# Patient Record
Sex: Male | Born: 1960 | Race: White | Hispanic: No | Marital: Married | Smoking: Never smoker
Health system: Southern US, Community
[De-identification: ages and names within clinical notes are randomized; demographics above are authoritative.]

## PROBLEM LIST (undated history)

## (undated) DIAGNOSIS — Z87442 Personal history of urinary calculi: Secondary | ICD-10-CM

## (undated) DIAGNOSIS — E559 Vitamin D deficiency, unspecified: Secondary | ICD-10-CM

## (undated) DIAGNOSIS — N189 Chronic kidney disease, unspecified: Secondary | ICD-10-CM

## (undated) DIAGNOSIS — D369 Benign neoplasm, unspecified site: Secondary | ICD-10-CM

## (undated) DIAGNOSIS — T7840XA Allergy, unspecified, initial encounter: Secondary | ICD-10-CM

## (undated) HISTORY — DX: Personal history of urinary calculi: Z87.442

## (undated) HISTORY — DX: Vitamin D deficiency, unspecified: E55.9

## (undated) HISTORY — DX: Benign neoplasm, unspecified site: D36.9

## (undated) HISTORY — PX: POLYPECTOMY: SHX149

## (undated) HISTORY — PX: COLONOSCOPY: SHX174

## (undated) HISTORY — DX: Allergy, unspecified, initial encounter: T78.40XA

---
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
## 2008-12-03 ENCOUNTER — Ambulatory Visit: Payer: Self-pay | Admitting: Internal Medicine

## 2009-07-15 ENCOUNTER — Ambulatory Visit: Payer: Self-pay | Admitting: Internal Medicine

## 2009-07-15 DIAGNOSIS — J069 Acute upper respiratory infection, unspecified: Secondary | ICD-10-CM | POA: Insufficient documentation

## 2010-12-16 ENCOUNTER — Ambulatory Visit (INDEPENDENT_AMBULATORY_CARE_PROVIDER_SITE_OTHER): Payer: Self-pay | Admitting: Internal Medicine

## 2010-12-16 ENCOUNTER — Encounter: Payer: Self-pay | Admitting: Internal Medicine

## 2010-12-16 VITALS — BP 120/80 | Temp 98.3°F | Ht 68.5 in | Wt 209.0 lb

## 2010-12-16 DIAGNOSIS — K1120 Sialoadenitis, unspecified: Secondary | ICD-10-CM

## 2010-12-16 MED ORDER — AMOXICILLIN-POT CLAVULANATE 500-125 MG PO TABS: 1.0000 | ORAL_TABLET | Freq: Three times a day (TID) | ORAL | Status: AC

## 2010-12-16 NOTE — Progress Notes (Signed)
  Subjective:    Patient ID: [name], male    DOB: [birth year], 50 y.o.   MRN: [id number]  HPI 50 year old patient who presents with a three-day history of increasing swelling involving his right neck and facial area. Denies any URI symptoms. Denies any sore throat no fever or chills or cough. He has had no similar problems in the past    Review of Systems  Constitutional: Negative for fever, chills, appetite change and fatigue.  HENT: Negative for hearing loss, ear pain, congestion, sore throat, trouble swallowing, neck stiffness, dental problem, voice change and tinnitus.   Eyes: Negative for pain, discharge and visual disturbance.  Respiratory: Negative for cough, chest tightness, wheezing and stridor.   Cardiovascular: Negative for chest pain, palpitations and leg swelling.  Gastrointestinal: Negative for nausea, vomiting, abdominal pain, diarrhea, constipation, blood in stool and abdominal distention.  Genitourinary: Negative for urgency, hematuria, flank pain, discharge, difficulty urinating and genital sores.  Musculoskeletal: Negative for myalgias, back pain, joint swelling, arthralgias and gait problem.  Skin: Negative for rash.  Neurological: Negative for dizziness, syncope, speech difficulty, weakness, numbness and headaches.  Hematological: Positive for adenopathy. Does not bruise/bleed easily.  Psychiatric/Behavioral: Negative for behavioral problems and dysphoric mood. The patient is not nervous/anxious.        Objective:   Physical Exam  Constitutional: He is oriented to person, place, and time. He appears well-developed and well-nourished. No distress.  HENT:  Head: Normocephalic.  Right Ear: External ear normal.  Left Ear: External ear normal.  Eyes: Conjunctivae and EOM are normal.  Neck: Normal range of motion.       There is swelling and slight tenderness of the right parotid gland  Cardiovascular: Normal rate and normal heart sounds.   Pulmonary/Chest:  Breath sounds normal.  Abdominal: Bowel sounds are normal.  Musculoskeletal: Normal range of motion. He exhibits no edema and no tenderness.  Neurological: He is alert and oriented to person, place, and time.  Psychiatric: He has a normal mood and affect. His behavior is normal.          Assessment & Plan:  Parotitis. We'll treat with Augmentin warm compresses and clinically observed. Has been asked to use tart candy. He will call if does not promptly improve or if he develops any fever chills or worsening pain

## 2010-12-16 NOTE — Patient Instructions (Signed)
Take your antibiotic as prescribed until ALL of it is gone, but stop if you develop a rash, swelling, or any side effects of the medication.  Contact our office as soon as possible if  there are side effects of the medication.  Warm compresses to area four times daily  Call or return to clinic prn if these symptoms worsen or fail to improve as anticipated.

## 2010-12-29 ENCOUNTER — Telehealth: Payer: Self-pay | Admitting: Internal Medicine

## 2010-12-29 DIAGNOSIS — R220 Localized swelling, mass and lump, head: Secondary | ICD-10-CM

## 2010-12-29 NOTE — Telephone Encounter (Signed)
Please advise 

## 2010-12-29 NOTE — Telephone Encounter (Signed)
Order placed - terri aware . Pt at ent office now. KIK

## 2010-12-29 NOTE — Telephone Encounter (Signed)
Ok for ent referral 

## 2010-12-29 NOTE — Telephone Encounter (Signed)
Pt called and said that condition has gotten worse and pt is req to get referral to ENT as previously discussed.

## 2012-05-09 ENCOUNTER — Other Ambulatory Visit (INDEPENDENT_AMBULATORY_CARE_PROVIDER_SITE_OTHER): Payer: BC Managed Care – PPO

## 2012-05-09 DIAGNOSIS — Z0000 Encounter for general adult medical examination without abnormal findings: Secondary | ICD-10-CM

## 2012-05-09 LAB — POCT URINALYSIS DIPSTICK
Bilirubin, UA: NEGATIVE
Leukocytes, UA: NEGATIVE
Nitrite, UA: NEGATIVE
pH, UA: 5.5

## 2012-05-09 LAB — CBC WITH DIFFERENTIAL/PLATELET
Basophils Absolute: 0.1 10*3/uL (ref 0.0–0.1)
Basophils Relative: 1.1 % (ref 0.0–3.0)
Eosinophils Absolute: 0.2 10*3/uL (ref 0.0–0.7)
MCHC: 32.7 g/dL (ref 30.0–36.0)
MCV: 90.1 fl (ref 78.0–100.0)
Monocytes Absolute: 0.6 10*3/uL (ref 0.1–1.0)
Neutrophils Relative %: 48 % (ref 43.0–77.0)
Platelets: 252 10*3/uL (ref 150.0–400.0)
RDW: 13.3 % (ref 11.5–14.6)

## 2012-05-09 LAB — PSA: PSA: 0.53 ng/mL (ref 0.10–4.00)

## 2012-05-09 LAB — BASIC METABOLIC PANEL
BUN: 20 mg/dL (ref 6–23)
CO2: 27 mEq/L (ref 19–32)
Calcium: 9 mg/dL (ref 8.4–10.5)
Chloride: 105 mEq/L (ref 96–112)
Creatinine, Ser: 1 mg/dL (ref 0.4–1.5)
Glucose, Bld: 85 mg/dL (ref 70–99)

## 2012-05-09 LAB — LIPID PANEL: HDL: 43 mg/dL (ref >39.00)

## 2012-05-09 LAB — HEPATIC FUNCTION PANEL
Bilirubin, Direct: 0.1 mg/dL (ref 0.0–0.3)
Total Bilirubin: 1 mg/dL (ref 0.3–1.2)
Total Protein: 6.3 g/dL (ref 6.0–8.3)

## 2012-05-13 ENCOUNTER — Encounter: Payer: Self-pay | Admitting: Internal Medicine

## 2012-05-20 ENCOUNTER — Encounter: Payer: Self-pay | Admitting: Internal Medicine

## 2012-05-20 ENCOUNTER — Ambulatory Visit (INDEPENDENT_AMBULATORY_CARE_PROVIDER_SITE_OTHER): Payer: BC Managed Care – PPO | Admitting: Internal Medicine

## 2012-05-20 VITALS — BP 102/70 | HR 76 | Temp 97.5°F | Resp 20 | Ht 68.5 in | Wt 218.0 lb

## 2012-05-20 DIAGNOSIS — Z0000 Encounter for general adult medical examination without abnormal findings: Secondary | ICD-10-CM

## 2012-05-20 NOTE — Progress Notes (Signed)
  Subjective:    Patient ID: [name], male    DOB: Aug 22, 1961, 51 y.o.   MRN: [id number]  HPI  Wt Readings from Last 3 Encounters:  05/20/12 218 lb (98.884 kg)  12/16/10 209 lb (94.802 kg)  07/15/09 215 lb (97.523 kg)    History of Present Illness:  51 year-old patient seen today for an annual examination. He has relocated from [place] and has enjoyed excellent health. He's had no prior hospital missions and has no medical illnesses. He takes no chronic medications   Problems Prior to Update:  None   Medications Prior to Update:  1) None   Allergies (verified):  No Known Drug Allergies   Past History:  Past Medical History:  unremarkable   Past Surgical History:  unremarkable   Family History:  Reviewed history and no changes required:  father died at age 50, H. MI, history tobacco use, and alcoholism  mother, age 50, in good health  One sister is well   Social History:  Reviewed history and no changes required:  Married  two sons, age 50 and 5  [employer] insurance agent   Review of Systems  Constitutional: Negative for fever, chills, activity change, appetite change and fatigue.  HENT: Negative for hearing loss, ear pain, congestion, rhinorrhea, sneezing, mouth sores, trouble swallowing, neck pain, neck stiffness, dental problem, voice change, sinus pressure and tinnitus.   Eyes: Negative for photophobia, pain, redness and visual disturbance.  Respiratory: Negative for apnea, cough, choking, chest tightness, shortness of breath and wheezing.   Cardiovascular: Negative for chest pain, palpitations and leg swelling.  Gastrointestinal: Negative for nausea, vomiting, abdominal pain, diarrhea, constipation, blood in stool, abdominal distention, anal bleeding and rectal pain.  Genitourinary: Negative for dysuria, urgency, frequency, hematuria, flank pain, decreased urine volume, discharge, penile swelling, scrotal swelling, difficulty urinating, genital sores and  testicular pain.  Musculoskeletal: Negative for myalgias, back pain, joint swelling, arthralgias and gait problem.  Skin: Negative for color change, rash and wound.  Neurological: Negative for dizziness, tremors, seizures, syncope, facial asymmetry, speech difficulty, weakness, light-headedness, numbness and headaches.  Hematological: Negative for adenopathy. Does not bruise/bleed easily.  Psychiatric/Behavioral: Negative for suicidal ideas, hallucinations, behavioral problems, confusion, disturbed wake/sleep cycle, self-injury, dysphoric mood, decreased concentration and agitation. The patient is not nervous/anxious.        Objective:   Physical Exam  Constitutional: He is oriented to person, place, and time. He appears well-developed.  HENT:  Head: Normocephalic.  Right Ear: External ear normal.  Left Ear: External ear normal.  Eyes: Conjunctivae and EOM are normal.  Neck: Normal range of motion.  Cardiovascular: Normal rate and normal heart sounds.   Pulmonary/Chest: Breath sounds normal.  Abdominal: Bowel sounds are normal.  Musculoskeletal: Normal range of motion. He exhibits no edema and no tenderness.  Neurological: He is alert and oriented to person, place, and time.  Psychiatric: He has a normal mood and affect. His behavior is normal.          Assessment & Plan:   Preventive health exam Mild exogenous obesity  Weight loss exercise all encouraged Screening colonoscopy encouraged Recheck 1 year

## 2012-05-20 NOTE — Patient Instructions (Signed)
It is important that you exercise regularly, at least 20 minutes 3 to 4 times per week.  If you develop chest pain or shortness of breath seek  medical attention.  You need to lose weight.  Consider a lower calorie diet and regular exercise.  Schedule your colonoscopy to help detect colon cancer. 

## 2012-06-08 ENCOUNTER — Encounter: Payer: Self-pay | Admitting: Internal Medicine

## 2012-07-04 ENCOUNTER — Encounter: Payer: Self-pay | Admitting: Internal Medicine

## 2012-07-22 ENCOUNTER — Encounter: Payer: BC Managed Care – PPO | Admitting: Internal Medicine

## 2012-08-22 ENCOUNTER — Ambulatory Visit (AMBULATORY_SURGERY_CENTER): Payer: BC Managed Care – PPO | Admitting: *Deleted

## 2012-08-22 VITALS — Ht 70.0 in | Wt 222.4 lb

## 2012-08-22 DIAGNOSIS — Z1211 Encounter for screening for malignant neoplasm of colon: Secondary | ICD-10-CM

## 2012-08-22 MED ORDER — PEG-KCL-NACL-NASULF-NA ASC-C 100 G PO SOLR: ORAL | Status: DC

## 2012-08-22 NOTE — Progress Notes (Signed)
No allergies to eggs or soy products 

## 2012-08-23 ENCOUNTER — Encounter: Payer: Self-pay | Admitting: Internal Medicine

## 2012-09-05 ENCOUNTER — Encounter: Payer: BC Managed Care – PPO | Admitting: Internal Medicine

## 2012-09-15 ENCOUNTER — Encounter: Payer: Self-pay | Admitting: Internal Medicine

## 2012-09-15 ENCOUNTER — Ambulatory Visit (AMBULATORY_SURGERY_CENTER): Payer: BC Managed Care – PPO | Admitting: Internal Medicine

## 2012-09-15 VITALS — BP 118/75 | HR 67 | Temp 97.9°F | Resp 13 | Ht 70.0 in | Wt 222.0 lb

## 2012-09-15 DIAGNOSIS — D126 Benign neoplasm of colon, unspecified: Secondary | ICD-10-CM

## 2012-09-15 DIAGNOSIS — Z1211 Encounter for screening for malignant neoplasm of colon: Secondary | ICD-10-CM

## 2012-09-15 MED ORDER — SODIUM CHLORIDE 0.9 % IV SOLN: 500.0000 mL | INTRAVENOUS | Status: DC

## 2012-09-15 NOTE — Op Note (Signed)
 [facility] [address] [place], [address]   COLONOSCOPY PROCEDURE REPORT  PATIENT: Ackley, Davy Westmoreland  MR#: [id number] BIRTHDATE: [birth year] , 51  yrs. old GENDER: Male ENDOSCOPIST: Beverley Fiedler, MD REFERRED XB:[id number], Peter PROCEDURE DATE:  09/15/2012 PROCEDURE:   Colonoscopy with snare polypectomy ASA CLASS:   Class I INDICATIONS:average risk screening and first colonoscopy. MEDICATIONS: MAC sedation, administered by CRNA and propofol (Diprivan) 300mg  IV  DESCRIPTION OF PROCEDURE:   After the risks benefits and alternatives of the procedure were thoroughly explained, informed consent was obtained.  A digital rectal exam revealed no rectal mass.   The LB [id number]  endoscope was introduced through the anus and advanced to the cecum, which was identified by both the appendix and ileocecal valve. No adverse events experienced. The quality of the prep was good, using MoviPrep  The instrument was then slowly withdrawn as the colon was fully examined.   COLON FINDINGS: A sessile polyp measuring 5 mm in size was found in the transverse colon.  A polypectomy was performed with a cold snare.  The resection was complete and the polyp tissue was completely retrieved.   Mild diverticulosis was noted in the ascending colon.   The colon mucosa was otherwise normal. Retroflexed views revealed no abnormalities. The time to cecum=2 minutes 31 seconds.  Withdrawal time=9 minutes 00 seconds.  The scope was withdrawn and the procedure completed.  COMPLICATIONS: There were no complications.  ENDOSCOPIC IMPRESSION: 1.   Sessile polyp measuring 5 mm in size was found in the transverse colon; polypectomy was performed with a cold snare 2.   Mild diverticulosis was noted in the ascending colon 3.   The colon mucosa was otherwise normal  RECOMMENDATIONS: 1.  Await pathology results 2.  High fiber diet 3.  If the polyps removed today is proven to be an  adenomatous (pre-cancerous) polyp, you will need a repeat colonoscopy in 5 years.  Otherwise you should continue to follow colorectal cancer screening guidelines for "routine risk" patients with colonoscopy in 10 years.  You will receive a letter within 1-2 weeks with the results of your biopsy as well as final recommendations.  Please call my office if you have not received a letter after 3 weeks.   eSigned:  Beverley Fiedler, MD 09/15/2012 3:10 PM cc:  Gordy Savers, MD and The Patient

## 2012-09-15 NOTE — Progress Notes (Signed)
Called to room to assist during endoscopic procedure.  Patient ID and intended procedure confirmed with present staff. Received instructions for my participation in the procedure from the performing physician.  

## 2012-09-15 NOTE — Patient Instructions (Addendum)
Handouts were given to your care partner on polyps, diverticulosis and high fiber diet.   You may resume your current medications today.  Please call if any questions or concerns.   YOU HAD AN ENDOSCOPIC PROCEDURE TODAY AT THE [facility]: Refer to the procedure report that was given to you for any specific questions about what was found during the examination.  If the procedure report does not answer your questions, please call your gastroenterologist to clarify.  If you requested that your care partner not be given the details of your procedure findings, then the procedure report has been included in a sealed envelope for you to review at your convenience later.  YOU SHOULD EXPECT: Some feelings of bloating in the abdomen. Passage of more gas than usual.  Walking can help get rid of the air that was put into your GI tract during the procedure and reduce the bloating. If you had a lower endoscopy (such as a colonoscopy or flexible sigmoidoscopy) you may notice spotting of blood in your stool or on the toilet paper. If you underwent a bowel prep for your procedure, then you may not have a normal bowel movement for a few days.  DIET: Your first meal following the procedure should be a light meal and then it is ok to progress to your normal diet.  A half-sandwich or bowl of soup is an example of a good first meal.  Heavy or fried foods are harder to digest and may make you feel nauseous or bloated.  Likewise meals heavy in dairy and vegetables can cause extra gas to form and this can also increase the bloating.  Drink plenty of fluids but you should avoid alcoholic beverages for 24 hours.  ACTIVITY: Your care partner should take you home directly after the procedure.  You should plan to take it easy, moving slowly for the rest of the day.  You can resume normal activity the day after the procedure however you should NOT DRIVE or use heavy machinery for 24 hours (because of the sedation medicines  used during the test).    SYMPTOMS TO REPORT IMMEDIATELY: A gastroenterologist can be reached at any hour.  During normal business hours, 8:30 AM to 5:00 PM Monday through Friday, call [phone].  After hours and on weekends, please call the GI answering service at [phone] who will take a message and have the physician on call contact you.   Following lower endoscopy (colonoscopy or flexible sigmoidoscopy):  Excessive amounts of blood in the stool  Significant tenderness or worsening of abdominal pains  Swelling of the abdomen that is new, acute  Fever of 100F or higher    FOLLOW UP: If any biopsies were taken you will be contacted by phone or by letter within the next 1-3 weeks.  Call your gastroenterologist if you have not heard about the biopsies in 3 weeks.  Our staff will call the home number listed on your records the next business day following your procedure to check on you and address any questions or concerns that you may have at that time regarding the information given to you following your procedure. This is a courtesy call and so if there is no answer at the home number and we have not heard from you through the emergency physician on call, we will assume that you have returned to your regular daily activities without incident.  SIGNATURES/CONFIDENTIALITY: You and/or your care partner have signed paperwork which will be entered into   your electronic medical record.  These signatures attest to the fact that that the information above on your After Visit Summary has been reviewed and is understood.  Full responsibility of the confidentiality of this discharge information lies with you and/or your care-partner.  

## 2012-09-15 NOTE — Progress Notes (Signed)
No complaints noted in the recovery room. Maw   

## 2012-09-15 NOTE — Progress Notes (Signed)
Patient did not experience any of the following events: a burn prior to discharge; a fall within the facility; wrong site/side/patient/procedure/implant event; or a hospital transfer or hospital admission upon discharge from the facility. (G8907) Patient did not have preoperative order for IV antibiotic SSI prophylaxis. (G8918)  

## 2012-09-16 ENCOUNTER — Telehealth: Payer: Self-pay | Admitting: *Deleted

## 2012-09-16 NOTE — Telephone Encounter (Signed)
No answer, left message to call office for questions.

## 2012-09-20 ENCOUNTER — Encounter: Payer: Self-pay | Admitting: Internal Medicine

## 2013-01-18 ENCOUNTER — Telehealth: Payer: Self-pay | Admitting: Internal Medicine

## 2013-01-18 MED ORDER — PENCICLOVIR 1 % EX CREA: TOPICAL_CREAM | Freq: Four times a day (QID) | CUTANEOUS | Status: DC

## 2013-01-18 NOTE — Telephone Encounter (Signed)
Spoke to pt told him Rx for Denavir cream was sent to pharmacy apply 4 times a day to area. Pt verbalized understanding.

## 2013-01-18 NOTE — Telephone Encounter (Signed)
Please call in a prescription for Denavir cream  apply to the area 4 times daily

## 2013-01-18 NOTE — Telephone Encounter (Signed)
Patient Information:  Caller Name: [name]  Phone: [phone]  Patient: [name]  Gender: Male  DOB: 11-07-1960  Age: 52 Years  PCP: Eleonore Chiqu[facility])  Office Follow Up:  Does the office need to follow up with this patient?: Yes  Instructions For The Office: would like a rx called in for a fever blister; please call back  RN Note:  uses [facility] at [facility]  Symptoms  Reason For Call & Symptoms: c/o fever blister to the lip; says it looks awful; says he had one in the past that he got a rx for and it disappeared quickly; using Blistex, but seems that it has only gotten worse  Reviewed Health History In EMR: Yes  Reviewed Medications In EMR: Yes  Reviewed Allergies In EMR: Yes  Reviewed Surgeries / Procedures: Yes  Date of Onset of Symptoms: 01/17/2013  Treatments Tried: Blistex  Treatments Tried Worked: No  Guideline(s) Used:  No Protocol Available - Sick Adult  Disposition Per Guideline:   Discuss with PCP and Callback by Nurse Today  Reason For Disposition Reached:   Nursing judgment  Advice Given:  N/A  Patient Will Follow Care Advice:  YES

## 2014-03-29 ENCOUNTER — Other Ambulatory Visit (INDEPENDENT_AMBULATORY_CARE_PROVIDER_SITE_OTHER): Payer: BC Managed Care – PPO

## 2014-03-29 DIAGNOSIS — Z0000 Encounter for general adult medical examination without abnormal findings: Secondary | ICD-10-CM

## 2014-03-29 LAB — POCT URINALYSIS DIPSTICK
Bilirubin, UA: NEGATIVE
Blood, UA: NEGATIVE
Glucose, UA: NEGATIVE
Ketones, UA: NEGATIVE
Leukocytes, UA: NEGATIVE
Nitrite, UA: NEGATIVE
SPEC GRAV UA: 1.025
Urobilinogen, UA: 0.2
pH, UA: 5.5

## 2014-03-29 LAB — LIPID PANEL
CHOL/HDL RATIO: 4
Cholesterol: 185 mg/dL (ref 0–200)
HDL: 47.5 mg/dL (ref >39.00)
LDL Cholesterol: 117 mg/dL — ABNORMAL HIGH (ref 0–99)
NONHDL: 137.5
Triglycerides: 105 mg/dL (ref 0.0–149.0)
VLDL: 21 mg/dL (ref 0.0–40.0)

## 2014-03-29 LAB — PSA: PSA: 0.73 ng/mL (ref 0.10–4.00)

## 2014-03-29 LAB — BASIC METABOLIC PANEL
BUN: 15 mg/dL (ref 6–23)
CALCIUM: 9.2 mg/dL (ref 8.4–10.5)
CHLORIDE: 105 meq/L (ref 96–112)
CO2: 31 meq/L (ref 19–32)
CREATININE: 1 mg/dL (ref 0.4–1.5)
GFR: 81.38 mL/min (ref >60.00)
GLUCOSE: 103 mg/dL — AB (ref 70–99)
Potassium: 4.4 mEq/L (ref 3.5–5.1)
Sodium: 138 mEq/L (ref 135–145)

## 2014-03-29 LAB — CBC WITH DIFFERENTIAL/PLATELET
BASOS ABS: 0 10*3/uL (ref 0.0–0.1)
BASOS PCT: 0.6 % (ref 0.0–3.0)
EOS ABS: 0.1 10*3/uL (ref 0.0–0.7)
Eosinophils Relative: 2.2 % (ref 0.0–5.0)
HCT: 46.1 % (ref 39.0–52.0)
HEMOGLOBIN: 15.6 g/dL (ref 13.0–17.0)
LYMPHS PCT: 37.3 % (ref 12.0–46.0)
Lymphs Abs: 2.1 10*3/uL (ref 0.7–4.0)
MCHC: 34 g/dL (ref 30.0–36.0)
MCV: 86.8 fl (ref 78.0–100.0)
MONO ABS: 0.5 10*3/uL (ref 0.1–1.0)
Monocytes Relative: 8.6 % (ref 3.0–12.0)
NEUTROS ABS: 2.9 10*3/uL (ref 1.4–7.7)
NEUTROS PCT: 51.3 % (ref 43.0–77.0)
Platelets: 279 10*3/uL (ref 150.0–400.0)
RBC: 5.31 Mil/uL (ref 4.22–5.81)
RDW: 13 % (ref 11.5–15.5)
WBC: 5.7 10*3/uL (ref 4.0–10.5)

## 2014-03-29 LAB — HEPATIC FUNCTION PANEL
ALBUMIN: 4.1 g/dL (ref 3.5–5.2)
ALK PHOS: 46 U/L (ref 39–117)
ALT: 37 U/L (ref 0–53)
AST: 23 U/L (ref 0–37)
Bilirubin, Direct: 0.1 mg/dL (ref 0.0–0.3)
TOTAL PROTEIN: 6.6 g/dL (ref 6.0–8.3)
Total Bilirubin: 0.7 mg/dL (ref 0.2–1.2)

## 2014-03-29 LAB — TSH: TSH: 2.23 u[IU]/mL (ref 0.35–4.50)

## 2014-04-05 ENCOUNTER — Encounter: Payer: Self-pay | Admitting: Internal Medicine

## 2014-04-05 ENCOUNTER — Ambulatory Visit (INDEPENDENT_AMBULATORY_CARE_PROVIDER_SITE_OTHER): Payer: BC Managed Care – PPO | Admitting: Internal Medicine

## 2014-04-05 VITALS — BP 132/84 | HR 76 | Temp 98.0°F | Ht 68.75 in | Wt 224.0 lb

## 2014-04-05 DIAGNOSIS — Z0000 Encounter for general adult medical examination without abnormal findings: Secondary | ICD-10-CM

## 2014-04-05 DIAGNOSIS — Z23 Encounter for immunization: Secondary | ICD-10-CM

## 2014-04-05 NOTE — Patient Instructions (Addendum)
It is important that you exercise regularly, at least 20 minutes 3 to 4 times per week.  If you develop chest pain or shortness of breath seek  medical attention.  You need to lose weight.  Consider a lower calorie diet and regular exercise.  Return in one year for follow-up Health Maintenance A healthy lifestyle and preventative care can promote health and wellness.  Maintain regular health, dental, and eye exams.  Eat a healthy diet. Foods like vegetables, fruits, whole grains, low-fat dairy products, and lean protein foods contain the nutrients you need and are low in calories. Decrease your intake of foods high in solid fats, added sugars, and salt. Get information about a proper diet from your health care provider, if necessary.  Regular physical exercise is one of the most important things you can do for your health. Most adults should get at least 150 minutes of moderate-intensity exercise (any activity that increases your heart rate and causes you to sweat) each week. In addition, most adults need muscle-strengthening exercises on 2 or more days a week.   Maintain a healthy weight. The body mass index (BMI) is a screening tool to identify possible weight problems. It provides an estimate of body fat based on height and weight. Your health care provider can find your BMI and can help you achieve or maintain a healthy weight. For males 20 years and older:  A BMI below 18.5 is considered underweight.  A BMI of 18.5 to 24.9 is normal.  A BMI of 25 to 29.9 is considered overweight.  A BMI of 30 and above is considered obese.  Maintain normal blood lipids and cholesterol by exercising and minimizing your intake of saturated fat. Eat a balanced diet with plenty of fruits and vegetables. Blood tests for lipids and cholesterol should begin at age 20 and be repeated every 5 years. If your lipid or cholesterol levels are high, you are over age 50, or you are at high risk for heart disease,  you may need your cholesterol levels checked more frequently.Ongoing high lipid and cholesterol levels should be treated with medicines if diet and exercise are not working.  If you smoke, find out from your health care provider how to quit. If you do not use tobacco, do not start.  Lung cancer screening is recommended for adults aged 55-80 years who are at high risk for developing lung cancer because of a history of smoking. A yearly low-dose CT scan of the lungs is recommended for people who have at least a 30-pack-year history of smoking and are current smokers or have quit within the past 15 years. A pack year of smoking is smoking an average of 1 pack of cigarettes a day for 1 year (for example, a 30-pack-year history of smoking could mean smoking 1 pack a day for 30 years or 2 packs a day for 15 years). Yearly screening should continue until the smoker has stopped smoking for at least 15 years. Yearly screening should be stopped for people who develop a health problem that would prevent them from having lung cancer treatment.  If you choose to drink alcohol, do not have more than 2 drinks per day. One drink is considered to be 12 oz (360 mL) of beer, 5 oz (150 mL) of wine, or 1.5 oz (45 mL) of liquor.  Avoid the use of street drugs. Do not share needles with anyone. Ask for help if you need support or instructions about stopping the use of drugs.    High blood pressure causes heart disease and increases the risk of stroke. Blood pressure should be checked at least every 1-2 years. Ongoing high blood pressure should be treated with medicines if weight loss and exercise are not effective.  If you are 33-2 years old, ask your health care provider if you should take aspirin to prevent heart disease.  Diabetes screening involves taking a blood sample to check your fasting blood sugar level. This should be done once every 3 years after age 43 if you are at a normal weight and without risk factors for  diabetes. Testing should be considered at a younger age or be carried out more frequently if you are overweight and have at least 1 risk factor for diabetes.  Colorectal cancer can be detected and often prevented. Most routine colorectal cancer screening begins at the age of 79 and continues through age 29. However, your health care provider may recommend screening at an earlier age if you have risk factors for colon cancer. On a yearly basis, your health care provider may provide home test kits to check for hidden blood in the stool. A small camera at the end of a tube may be used to directly examine the colon (sigmoidoscopy or colonoscopy) to detect the earliest forms of colorectal cancer. Talk to your health care provider about this at age 58 when routine screening begins. A direct exam of the colon should be repeated every 5-10 years through age 50, unless early forms of precancerous polyps or small growths are found.  People who are at an increased risk for hepatitis B should be screened for this virus. You are considered at high risk for hepatitis B if:  You were born in a country where hepatitis B occurs often. Talk with your health care provider about which countries are considered high risk.  Your parents were born in a high-risk country and you have not received a shot to protect against hepatitis B (hepatitis B vaccine).  You have HIV or AIDS.  You use needles to inject street drugs.  You live with, or have sex with, someone who has hepatitis B.  You are a man who has sex with other men (MSM).  You get hemodialysis treatment.  You take certain medicines for conditions like cancer, organ transplantation, and autoimmune conditions.  Hepatitis C blood testing is recommended for all people born from [birth year] through 1964 and any individual with known risk factors for hepatitis C.  Healthy men should no longer receive prostate-specific antigen (PSA) blood tests as part of routine cancer  screening. Talk to your health care provider about prostate cancer screening.  Testicular cancer screening is not recommended for adolescents or adult males who have no symptoms. Screening includes self-exam, a health care provider exam, and other screening tests. Consult with your health care provider about any symptoms you have or any concerns you have about testicular cancer.  Practice safe sex. Use condoms and avoid high-risk sexual practices to reduce the spread of sexually transmitted infections (STIs).  You should be screened for STIs, including gonorrhea and chlamydia if:  You are sexually active and are younger than 24 years.  You are older than 24 years, and your health care provider tells you that you are at risk for this type of infection.  Your sexual activity has changed since you were last screened, and you are at an increased risk for chlamydia or gonorrhea. Ask your health care provider if you are at risk.  If you are  If you are at risk of being infected with HIV, it is recommended that you take a prescription medicine daily to prevent HIV infection. This is called pre-exposure prophylaxis (PrEP). You are considered at risk if:  You are a man who has sex with other men (MSM).  You are a heterosexual man who is sexually active with multiple partners.  You take drugs by injection.  You are sexually active with a partner who has HIV.  Talk with your health care provider about whether you are at high risk of being infected with HIV. If you choose to begin PrEP, you should first be tested for HIV. You should then be tested every 3 months for as long as you are taking PrEP.  Use sunscreen. Apply sunscreen liberally and repeatedly throughout the day. You should seek shade when your shadow is shorter than you. Protect yourself by wearing long sleeves, pants, a wide-brimmed hat, and sunglasses year round whenever you are outdoors.  Tell your health care provider of new moles or changes in  moles, especially if there is a change in shape or color. Also, tell your health care provider if a mole is larger than the size of a pencil eraser.  A one-time screening for abdominal aortic aneurysm (AAA) and surgical repair of large AAAs by ultrasound is recommended for men aged 65-75 years who are current or former smokers.  Stay current with your vaccines (immunizations). Document Released: 02/27/2008 Document Revised: 09/05/2013 Document Reviewed: 01/26/2011 Exit[employer] Patient Information 2015 [employer]. This information is not intended to replace advice given to you by your health care provider. Make sure you discuss any questions you have with your health care provider.  

## 2014-04-05 NOTE — Progress Notes (Signed)
  Subjective:    Patient ID: [name], male    DOB: April 13, 1961, 53 y.o.   MRN: [id number]  HPI   Wt Readings from Last 3 Encounters:  04/05/14 224 lb (101.606 kg)  09/15/12 222 lb (100.699 kg)  08/22/12 222 lb 6.4 oz (100.88 kg)    History of Present Illness:   53 year-old patient seen today for an annual examination.  He has relocated from [place] a couple of years ago, but is a [place] native;  and has enjoyed excellent health. He's had no prior hospital admissions and has no medical illnesses. He takes no chronic medications   Problems Prior to Update:  None   Medications Prior to Update:  1) None   Allergies (verified):  No Known Drug Allergies   Past History:  Past Medical History:  Colonic polyps  Past Surgical History:  unremarkable  Colonoscopy January 2014  Family History:   father died at age 71, H. MI, history tobacco use, and alcoholism  mother, age 61, in good health  One sister is well   Social History:   Married  two sons, age 50 and 46  [employer] insurance agent   Review of Systems  Constitutional: Negative for fever, chills, activity change, appetite change and fatigue.  HENT: Negative for congestion, dental problem, ear pain, hearing loss, mouth sores, rhinorrhea, sinus pressure, sneezing, tinnitus, trouble swallowing and voice change.   Eyes: Negative for photophobia, pain, redness and visual disturbance.  Respiratory: Negative for apnea, cough, choking, chest tightness, shortness of breath and wheezing.   Cardiovascular: Negative for chest pain, palpitations and leg swelling.  Gastrointestinal: Negative for nausea, vomiting, abdominal pain, diarrhea, constipation, blood in stool, abdominal distention, anal bleeding and rectal pain.  Genitourinary: Negative for dysuria, urgency, frequency, hematuria, flank pain, decreased urine volume, discharge, penile swelling, scrotal swelling, difficulty urinating, genital sores and testicular  pain.  Musculoskeletal: Negative for arthralgias, back pain, gait problem, joint swelling, myalgias, neck pain and neck stiffness.  Skin: Negative for color change, rash and wound.  Neurological: Negative for dizziness, tremors, seizures, syncope, facial asymmetry, speech difficulty, weakness, light-headedness, numbness and headaches.  Hematological: Negative for adenopathy. Does not bruise/bleed easily.  Psychiatric/Behavioral: Negative for suicidal ideas, hallucinations, behavioral problems, confusion, sleep disturbance, self-injury, dysphoric mood, decreased concentration and agitation. The patient is not nervous/anxious.        Objective:   Physical Exam  Constitutional: He is oriented to person, place, and time. He appears well-developed.  HENT:  Head: Normocephalic.  Right Ear: External ear normal.  Left Ear: External ear normal.  Eyes: Conjunctivae and EOM are normal.  Neck: Normal range of motion.  Cardiovascular: Normal rate and normal heart sounds.   Pulmonary/Chest: Breath sounds normal.  Abdominal: Bowel sounds are normal.  Musculoskeletal: Normal range of motion. He exhibits no edema and no tenderness.  Neurological: He is alert and oriented to person, place, and time.  Psychiatric: He has a normal mood and affect. His behavior is normal.          Assessment & Plan:   Preventive health exam Mild exogenous obesity History of colonic polyps.  Followup colonoscopy 4 years  Weight loss exercise all encouraged  Recheck 1 year

## 2014-04-05 NOTE — Progress Notes (Signed)
Pre visit review using our clinic review tool, if applicable. No additional management support is needed unless otherwise documented below in the visit note. 

## 2014-10-19 ENCOUNTER — Emergency Department (HOSPITAL_COMMUNITY): Payer: 59

## 2014-10-19 ENCOUNTER — Emergency Department (HOSPITAL_COMMUNITY)
Admission: EM | Admit: 2014-10-19 | Discharge: 2014-10-20 | Disposition: A | Discharge status: Home / Self Care | Payer: 59 | Attending: Emergency Medicine | Admitting: Emergency Medicine

## 2014-10-19 ENCOUNTER — Encounter (HOSPITAL_COMMUNITY): Payer: Self-pay | Admitting: Emergency Medicine

## 2014-10-19 DIAGNOSIS — N200 Calculus of kidney: Secondary | ICD-10-CM | POA: Insufficient documentation

## 2014-10-19 DIAGNOSIS — R1031 Right lower quadrant pain: Secondary | ICD-10-CM

## 2014-10-19 LAB — CBC WITH DIFFERENTIAL/PLATELET
BASOS ABS: 0.1 10*3/uL (ref 0.0–0.1)
Basophils Relative: 1 % (ref 0–1)
EOS PCT: 2 % (ref 0–5)
Eosinophils Absolute: 0.2 10*3/uL (ref 0.0–0.7)
HEMATOCRIT: 43.1 % (ref 39.0–52.0)
Hemoglobin: 15.3 g/dL (ref 13.0–17.0)
LYMPHS ABS: 3 10*3/uL (ref 0.7–4.0)
Lymphocytes Relative: 34 % (ref 12–46)
MCH: 29.5 pg (ref 26.0–34.0)
MCHC: 35.5 g/dL (ref 30.0–36.0)
MCV: 83.2 fL (ref 78.0–100.0)
Monocytes Absolute: 0.8 10*3/uL (ref 0.1–1.0)
Monocytes Relative: 10 % (ref 3–12)
NEUTROS PCT: 53 % (ref 43–77)
Neutro Abs: 4.7 10*3/uL (ref 1.7–7.7)
PLATELETS: 260 10*3/uL (ref 150–400)
RBC: 5.18 MIL/uL (ref 4.22–5.81)
RDW: 12.7 % (ref 11.5–15.5)
WBC: 8.7 10*3/uL (ref 4.0–10.5)

## 2014-10-19 LAB — COMPREHENSIVE METABOLIC PANEL
ALBUMIN: 4.2 g/dL (ref 3.5–5.2)
ALK PHOS: 46 U/L (ref 39–117)
ALT: 47 U/L (ref 0–53)
ANION GAP: 10 (ref 5–15)
AST: 27 U/L (ref 0–37)
BUN: 16 mg/dL (ref 6–23)
CO2: 23 mmol/L (ref 19–32)
CREATININE: 1.19 mg/dL (ref 0.50–1.35)
Calcium: 9.1 mg/dL (ref 8.4–10.5)
Chloride: 106 mmol/L (ref 96–112)
GFR calc Af Amer: 80 mL/min — ABNORMAL LOW (ref >90)
GFR calc non Af Amer: 69 mL/min — ABNORMAL LOW (ref >90)
GLUCOSE: 133 mg/dL — AB (ref 70–99)
POTASSIUM: 4 mmol/L (ref 3.5–5.1)
Sodium: 139 mmol/L (ref 135–145)
Total Bilirubin: 0.6 mg/dL (ref 0.3–1.2)
Total Protein: 6.8 g/dL (ref 6.0–8.3)

## 2014-10-19 LAB — URINALYSIS, ROUTINE W REFLEX MICROSCOPIC
Bilirubin Urine: NEGATIVE
GLUCOSE, UA: NEGATIVE mg/dL
Hgb urine dipstick: NEGATIVE
Ketones, ur: NEGATIVE mg/dL
Leukocytes, UA: NEGATIVE
NITRITE: NEGATIVE
PH: 6 (ref 5.0–8.0)
Protein, ur: NEGATIVE mg/dL
SPECIFIC GRAVITY, URINE: 1.024 (ref 1.005–1.030)
Urobilinogen, UA: 0.2 mg/dL (ref 0.0–1.0)

## 2014-10-19 LAB — LIPASE, BLOOD: LIPASE: 35 U/L (ref 11–59)

## 2014-10-19 MED ORDER — OXYCODONE-ACETAMINOPHEN 5-325 MG PO TABS
1.0000 | ORAL_TABLET | Freq: Once | ORAL | Status: AC
  Administered 2014-10-19: 1 via ORAL
  Filled 2014-10-19: qty 1

## 2014-10-19 MED ORDER — ONDANSETRON HCL 4 MG/2ML IJ SOLN
4.0000 mg | Freq: Once | INTRAMUSCULAR | Status: AC
  Administered 2014-10-19: 4 mg via INTRAVENOUS
  Filled 2014-10-19: qty 2

## 2014-10-19 MED ORDER — SODIUM CHLORIDE 0.9 % IV SOLN
Freq: Once | INTRAVENOUS | Status: AC
  Administered 2014-10-19: 22:00:00 via INTRAVENOUS

## 2014-10-19 MED ORDER — KETOROLAC TROMETHAMINE 30 MG/ML IJ SOLN
30.0000 mg | Freq: Once | INTRAMUSCULAR | Status: AC
  Administered 2014-10-19: 30 mg via INTRAVENOUS
  Filled 2014-10-19: qty 1

## 2014-10-19 MED ORDER — TAMSULOSIN HCL 0.4 MG PO CAPS
0.4000 mg | ORAL_CAPSULE | ORAL | Status: AC
  Administered 2014-10-19: 0.4 mg via ORAL
  Filled 2014-10-19: qty 1

## 2014-10-19 MED ORDER — IOHEXOL 300 MG/ML  SOLN
25.0000 mL | Freq: Once | INTRAMUSCULAR | Status: AC | PRN
  Administered 2014-10-19: 25 mL via ORAL

## 2014-10-19 MED ORDER — OXYCODONE-ACETAMINOPHEN 5-325 MG PO TABS: 1.0000 | ORAL_TABLET | Freq: Four times a day (QID) | ORAL | Status: DC | PRN

## 2014-10-19 MED ORDER — ONDANSETRON 4 MG PO TBDP: 4.0000 mg | ORAL_TABLET | Freq: Three times a day (TID) | ORAL | Status: DC | PRN

## 2014-10-19 MED ORDER — TAMSULOSIN HCL 0.4 MG PO CAPS: 0.4000 mg | ORAL_CAPSULE | Freq: Every day | ORAL | Status: DC

## 2014-10-19 MED ORDER — HYDROMORPHONE HCL 1 MG/ML IJ SOLN
1.0000 mg | Freq: Once | INTRAMUSCULAR | Status: AC
Start: 2014-10-19 — End: 2014-10-19
  Administered 2014-10-19: 1 mg via INTRAVENOUS
  Filled 2014-10-19: qty 1

## 2014-10-19 MED ORDER — IOHEXOL 300 MG/ML  SOLN
100.0000 mL | Freq: Once | INTRAMUSCULAR | Status: AC | PRN
  Administered 2014-10-19: 100 mL via INTRAVENOUS

## 2014-10-19 NOTE — Discharge Instructions (Signed)
Please make an appointment with Urology for follow up Please strain all urine and save any stone passed

## 2014-10-19 NOTE — ED Provider Notes (Signed)
CSN: [id number]     Arrival date & time 10/19/14  2032 History   First MD Initiated Contact with Patient 10/19/14 2143     Chief Complaint  Patient presents with  . Abdominal Pain     (Consider location/radiation/quality/duration/timing/severity/associated sxs/prior Treatment) HPI Comments: Sudden onset R side pain with nausea that now radiates to groin  Had one loose BM that did not relieve the pain   Pain is increasing  Denies Hx kidney stones, recent illness, trauma, diverticular disease, abdominal surgeries   Patient is a 54 y.o. male presenting with abdominal pain. The history is provided by the patient.  Abdominal Pain Pain location:  RLQ Pain quality: aching and pressure   Pain radiates to:  Suprapubic region and groin Pain severity:  Severe Onset quality:  Gradual Duration:  3 hours Timing:  Constant Progression:  Worsening Chronicity:  New Context: eating   Context: not alcohol use, not previous surgeries and not trauma   Relieved by:  Nothing Worsened by:  Movement Ineffective treatments:  None tried Associated symptoms: dysuria and nausea   Associated symptoms: no constipation, no diarrhea, no fever, no hematuria and no vomiting   Nausea:    Severity:  Moderate   Onset quality:  Gradual   Duration:  3 hours   Timing:  Constant   Progression:  Worsening Risk factors: no alcohol abuse, not elderly and not obese     History reviewed. No pertinent past medical history. History reviewed. No pertinent past surgical history. Family History  Problem Relation Age of Onset  . Alcohol abuse Father   . Colon cancer Neg Hx   . Esophageal cancer Neg Hx   . Stomach cancer Neg Hx   . Rectal cancer Neg Hx    History  Substance Use Topics  . Smoking status: Never Smoker   . Smokeless tobacco: Never Used  . Alcohol Use: Yes     Comment: rarely    Review of Systems  Constitutional: Negative for fever.  Gastrointestinal: Positive for nausea and abdominal pain.  Negative for vomiting, diarrhea, constipation, blood in stool and anal bleeding.  Genitourinary: Positive for dysuria and flank pain. Negative for urgency, frequency, hematuria, scrotal swelling and testicular pain.  Skin: Negative for rash and wound.  All other systems reviewed and are negative.     Allergies  Review of patient's allergies indicates no known allergies.  Home Medications   Prior to Admission medications   Medication Sig Start Date End Date Taking? Authorizing Provider  ondansetron (ZOFRAN ODT) 4 MG disintegrating tablet Take 1 tablet (4 mg total) by mouth every 8 (eight) hours as needed for nausea or vomiting. 10/19/14   Garald Balding, NP  oxyCODONE-acetaminophen (PERCOCET/ROXICET) 5-325 MG per tablet Take 1 tablet by mouth every 6 (six) hours as needed for severe pain. 10/19/14   Garald Balding, NP  tamsulosin (FLOMAX) 0.4 MG CAPS capsule Take 1 capsule (0.4 mg total) by mouth daily after supper. 10/19/14   Garald Balding, NP   BP 133/85 mmHg  Pulse 90  Temp(Src) 97.4 F (36.3 C) (Oral)  Resp 16  Ht 5\' 9"  (1.753 m)  Wt 223 lb (101.152 kg)  BMI 32.92 kg/m2  SpO2 99% Physical Exam  Constitutional: He is oriented to person, place, and time. He appears well-developed and well-nourished. He appears distressed.  HENT:  Head: Normocephalic.  Right Ear: External ear normal.  Left Ear: External ear normal.  Eyes: Pupils are equal, round, and reactive to light.  Neck: Normal range of motion.  Cardiovascular: Normal rate and regular rhythm.   Pulmonary/Chest: Effort normal and breath sounds normal.  Abdominal: Soft. He exhibits no distension. There is no hepatosplenomegaly. There is tenderness in the right lower quadrant, periumbilical area and suprapubic area. There is no guarding and no CVA tenderness.    Genitourinary: Right testis shows no tenderness. Left testis shows no tenderness.  Musculoskeletal: Normal range of motion.  Neurological: He is alert and oriented to  person, place, and time.  Skin: Skin is warm and dry.  Nursing note and vitals reviewed.   ED Course  Procedures (including critical care time) Labs Review Labs Reviewed  COMPREHENSIVE METABOLIC PANEL - Abnormal; Notable for the following:    Glucose, Bld 133 (*)    GFR calc non Af Amer 69 (*)    GFR calc Af Amer 80 (*)    All other components within normal limits  CBC WITH DIFFERENTIAL/PLATELET  LIPASE, BLOOD  URINALYSIS, ROUTINE W REFLEX MICROSCOPIC    Imaging Review Ct Abdomen Pelvis W Contrast  10/19/2014   CLINICAL DATA:  Right lower quadrant abdominal pain with nausea and diarrhea beginning this evening.  EXAM: CT ABDOMEN AND PELVIS WITH CONTRAST  TECHNIQUE: Multidetector CT imaging of the abdomen and pelvis was performed using the standard protocol following bolus administration of intravenous contrast.  CONTRAST:  165mL OMNIPAQUE IOHEXOL 300 MG/ML  SOLN  COMPARISON:  None.  FINDINGS: Lower chest: Dependent bibasilar atelectasis but no pleural effusion or pulmonary lesion. The heart is normal in size. No pericardial effusion. The distal esophagus is grossly normal.  Hepatobiliary: Areas of contrast enhancement noted in segment 7 and 5 of the liver could reflect a flash filling hemangiomas, vascular shunts or focal fatty sparing. Diffuse fatty infiltration of the liver is noted. There are several small low-attenuation lesions which are too small to accurately characterize but are likely benign cysts. The gallbladder is normal. No common bile duct dilatation.  Pancreas: Normal  Spleen: Normal  Adrenals/Urinary Tract: The adrenal glands are normal. The left kidney is normal. There is right-sided hydroureteronephrosis down to an obstructing 4 x 4 mm calculus in the distal ureter proximal to the UVJ. No bladder calculi.  Stomach/Bowel: The stomach, duodenum, small bowel and colon are unremarkable. No inflammatory changes, mass lesions or obstructive findings. The terminal ileum is normal. The  appendix is normal.  Vascular/Lymphatic: No mesenteric or retroperitoneal mass or adenopathy. The aorta and branch vessels are normal. The major venous structures are patent.  Other: The bladder, prostate gland and seminal vesicles are unremarkable. No pelvic mass or adenopathy. No free pelvic fluid collections. No inguinal mass or adenopathy.  Musculoskeletal: No significant bony findings.  IMPRESSION: 4 mm right distal ureteral calculus with moderate hydroureteronephrosis.  Benign-appearing liver lesions as discussed above.   Electronically Signed   By: Kalman Jewels M.D.   On: 10/19/2014 23:28     EKG Interpretation None     Patient has been instructed to strain all urine to FU with urology  He has been given Rx for Zofran, Percocet and Flomax  To return if can  Not control pain at home  MDM   Final diagnoses:  Right lower quadrant pain  Kidney stone        Garald Balding, NP 10/19/14 2349  Orpah Greek, MD 10/19/14 2351

## 2014-10-19 NOTE — ED Notes (Signed)
Pt. reports RLQ pain radiating to right testicle with nausea and diarrhea x1 after eating dinner this evening , denies fever or chills.

## 2014-10-23 ENCOUNTER — Telehealth: Payer: Self-pay | Admitting: Internal Medicine

## 2014-10-23 DIAGNOSIS — N200 Calculus of kidney: Secondary | ICD-10-CM

## 2014-10-23 NOTE — Telephone Encounter (Signed)
Spoke to pt, asked him if he is having pain. Pt said some has not passed kidney stone yet. Told him order for urgent referral to Urologist was done and someone will contact him tomorrow regarding an appointment. Pt verbalized understanding.

## 2014-10-23 NOTE — Telephone Encounter (Signed)
Pt was dx kidney stone at [facility] and needs a referral to urologist. Can we refer? Pt has [facility].

## 2014-10-23 NOTE — Telephone Encounter (Signed)
Ok to refer.

## 2014-10-23 NOTE — Telephone Encounter (Signed)
Okay to refer to Urology

## 2015-04-03 ENCOUNTER — Other Ambulatory Visit: Payer: 59

## 2015-04-11 ENCOUNTER — Encounter: Payer: 59 | Admitting: Internal Medicine

## 2015-08-07 ENCOUNTER — Telehealth: Payer: Self-pay | Admitting: Internal Medicine

## 2015-08-07 MED ORDER — PENCICLOVIR 1 % EX CREA: TOPICAL_CREAM | Freq: Four times a day (QID) | CUTANEOUS | Status: DC

## 2015-08-07 NOTE — Telephone Encounter (Signed)
Pt notified Rx sent to pharmacy

## 2015-08-07 NOTE — Telephone Encounter (Signed)
Pt needs refill on  denavir call into [facility]. Pt has an appt next year

## 2015-09-23 ENCOUNTER — Other Ambulatory Visit: Payer: Self-pay

## 2015-09-25 ENCOUNTER — Other Ambulatory Visit (INDEPENDENT_AMBULATORY_CARE_PROVIDER_SITE_OTHER): Payer: BLUE CROSS/BLUE SHIELD

## 2015-09-25 DIAGNOSIS — Z0000 Encounter for general adult medical examination without abnormal findings: Secondary | ICD-10-CM

## 2015-09-25 LAB — CBC WITH DIFFERENTIAL/PLATELET
BASOS PCT: 0.8 % (ref 0.0–3.0)
Basophils Absolute: 0.1 10*3/uL (ref 0.0–0.1)
Eosinophils Absolute: 0.1 10*3/uL (ref 0.0–0.7)
Eosinophils Relative: 2.5 % (ref 0.0–5.0)
HEMATOCRIT: 46.2 % (ref 39.0–52.0)
HEMOGLOBIN: 15.4 g/dL (ref 13.0–17.0)
Lymphocytes Relative: 36.8 % (ref 12.0–46.0)
Lymphs Abs: 2.2 10*3/uL (ref 0.7–4.0)
MCHC: 33.4 g/dL (ref 30.0–36.0)
MCV: 87.7 fl (ref 78.0–100.0)
MONOS PCT: 10.6 % (ref 3.0–12.0)
Monocytes Absolute: 0.6 10*3/uL (ref 0.1–1.0)
NEUTROS ABS: 2.9 10*3/uL (ref 1.4–7.7)
Neutrophils Relative %: 49.3 % (ref 43.0–77.0)
PLATELETS: 297 10*3/uL (ref 150.0–400.0)
RBC: 5.27 Mil/uL (ref 4.22–5.81)
RDW: 12.8 % (ref 11.5–15.5)
WBC: 6 10*3/uL (ref 4.0–10.5)

## 2015-09-25 LAB — POCT URINALYSIS DIPSTICK
Bilirubin, UA: NEGATIVE
Blood, UA: NEGATIVE
Glucose, UA: NEGATIVE
KETONES UA: NEGATIVE
LEUKOCYTES UA: NEGATIVE
Nitrite, UA: NEGATIVE
Protein, UA: NEGATIVE
Spec Grav, UA: 1.02
Urobilinogen, UA: 0.2
pH, UA: 6

## 2015-09-25 LAB — HEPATIC FUNCTION PANEL
ALBUMIN: 4.5 g/dL (ref 3.5–5.2)
ALK PHOS: 52 U/L (ref 39–117)
ALT: 32 U/L (ref 0–53)
AST: 17 U/L (ref 0–37)
Bilirubin, Direct: 0.1 mg/dL (ref 0.0–0.3)
TOTAL PROTEIN: 6.5 g/dL (ref 6.0–8.3)
Total Bilirubin: 0.6 mg/dL (ref 0.2–1.2)

## 2015-09-25 LAB — LIPID PANEL
CHOLESTEROL: 195 mg/dL (ref 0–200)
HDL: 47.5 mg/dL (ref >39.00)
LDL Cholesterol: 108 mg/dL — ABNORMAL HIGH (ref 0–99)
NonHDL: 147.87
Total CHOL/HDL Ratio: 4
Triglycerides: 199 mg/dL — ABNORMAL HIGH (ref 0.0–149.0)
VLDL: 39.8 mg/dL (ref 0.0–40.0)

## 2015-09-25 LAB — BASIC METABOLIC PANEL
BUN: 19 mg/dL (ref 6–23)
CHLORIDE: 104 meq/L (ref 96–112)
CO2: 31 meq/L (ref 19–32)
CREATININE: 0.98 mg/dL (ref 0.40–1.50)
Calcium: 9.8 mg/dL (ref 8.4–10.5)
GFR: 84.75 mL/min (ref >60.00)
Glucose, Bld: 97 mg/dL (ref 70–99)
POTASSIUM: 5 meq/L (ref 3.5–5.1)
SODIUM: 141 meq/L (ref 135–145)

## 2015-09-25 LAB — PSA: PSA: 0.65 ng/mL (ref 0.10–4.00)

## 2015-09-25 LAB — TSH: TSH: 2.68 u[IU]/mL (ref 0.35–4.50)

## 2015-09-30 ENCOUNTER — Ambulatory Visit (INDEPENDENT_AMBULATORY_CARE_PROVIDER_SITE_OTHER): Payer: BLUE CROSS/BLUE SHIELD | Admitting: Internal Medicine

## 2015-09-30 ENCOUNTER — Encounter: Payer: Self-pay | Admitting: Internal Medicine

## 2015-09-30 VITALS — BP 140/80 | HR 84 | Temp 98.1°F | Resp 20 | Ht 68.5 in | Wt 227.0 lb

## 2015-09-30 DIAGNOSIS — Z0000 Encounter for general adult medical examination without abnormal findings: Secondary | ICD-10-CM | POA: Diagnosis not present

## 2015-09-30 IMAGING — CT CT ABD-PELV W/ CM
2 of 5 series · 4 of 46 positions shown, 6 images · IV contrast (Iodine)
Comparison: None.

CLINICAL DATA: Right lower quadrant abdominal pain with nausea and
diarrhea beginning this evening.

EXAM:
CT ABDOMEN AND PELVIS WITH CONTRAST
TECHNIQUE: Multidetector CT imaging of the abdomen and pelvis was performed
using the standard protocol following bolus administration of
intravenous contrast.
CONTRAST:  100mL OMNIPAQUE IOHEXOL 300 MG/ML  SOLN

[Series 206: coronals · coronal · 0.50mm/px · 3 of 153 slices shown, 4 images]
[im 34/153  soft-tissue]
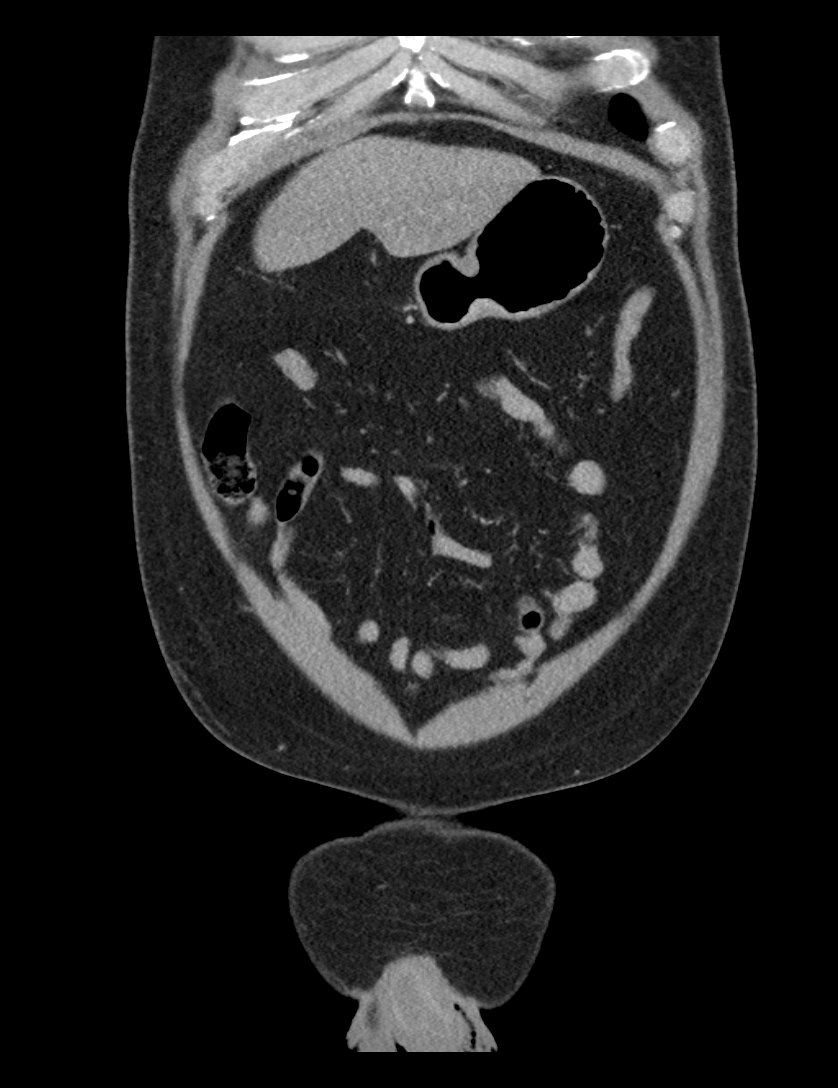
[im 34/153  bone]
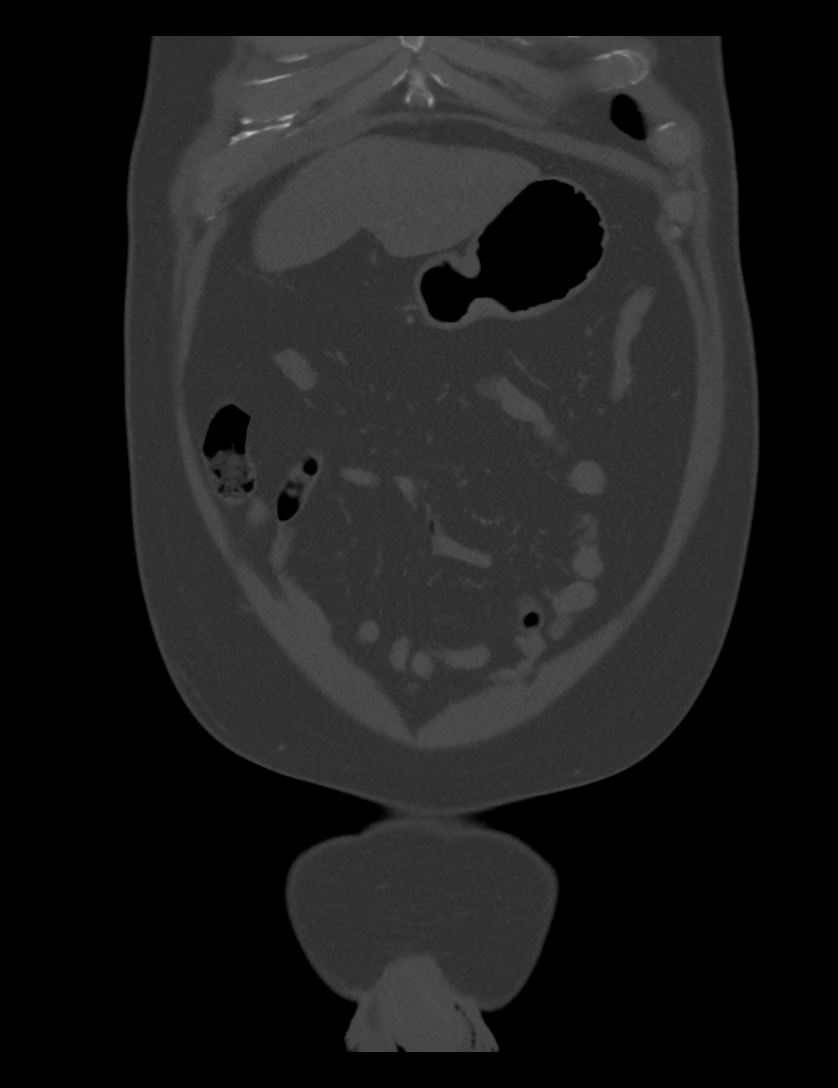
[im 85/153  soft-tissue]
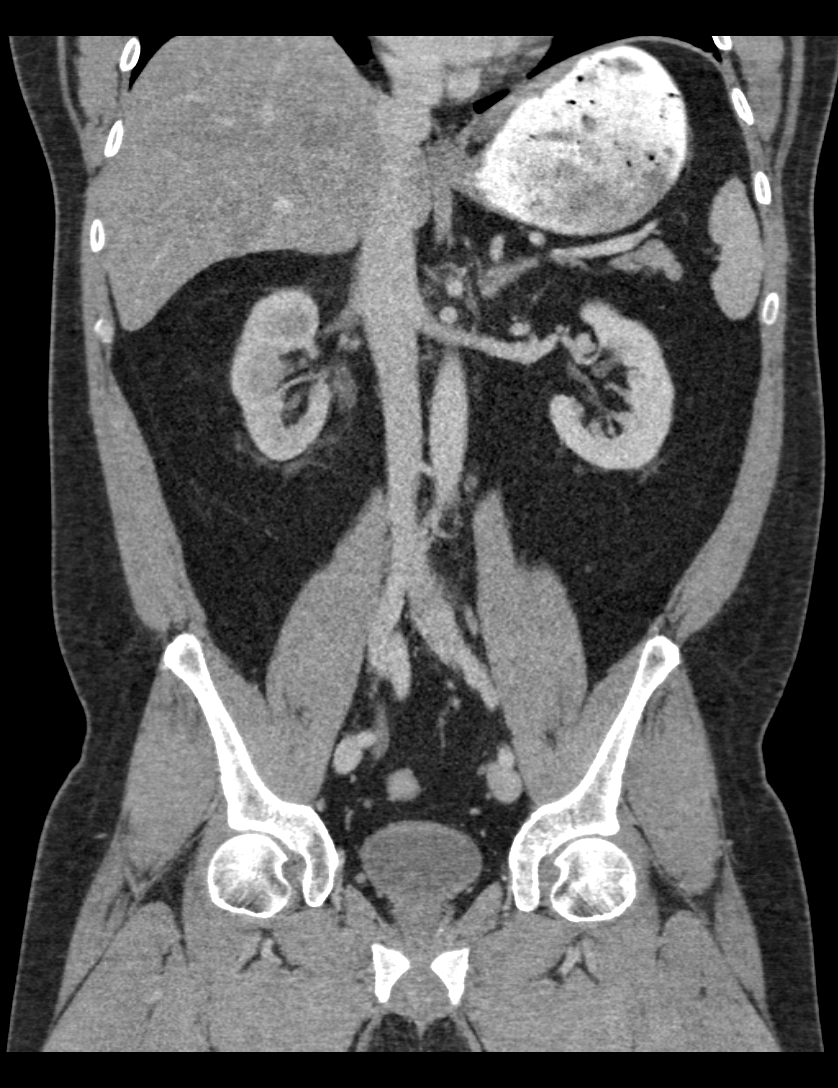
[im 119/153  soft-tissue]
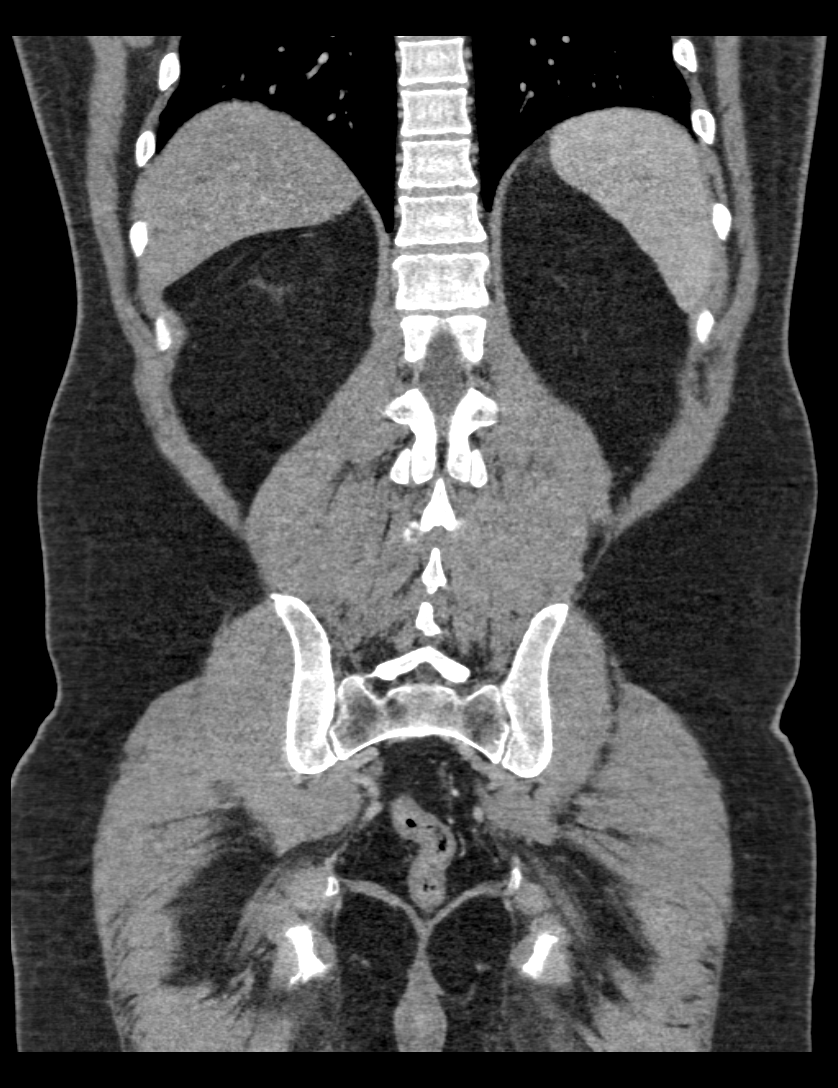

[Series 207: sagittal · sagittal · 0.50mm/px · 1 of 183 slices shown, 2 images]
[im 61/183  soft-tissue]
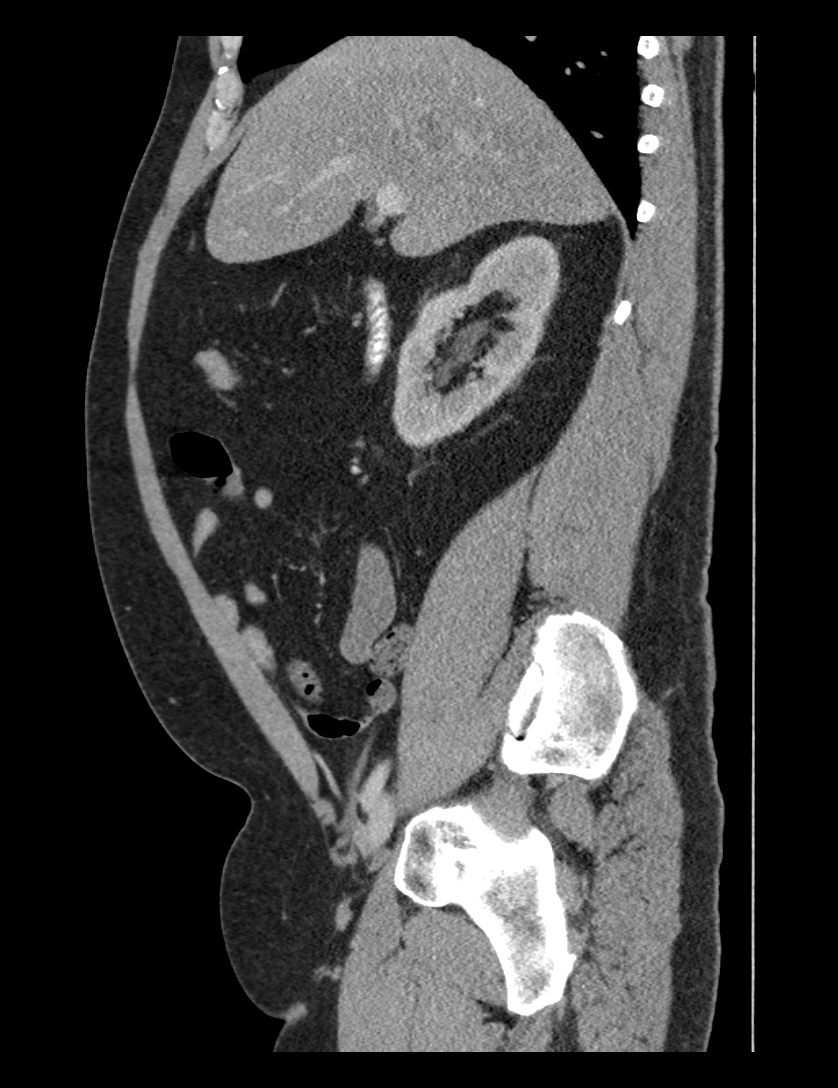
[im 61/183  bone]
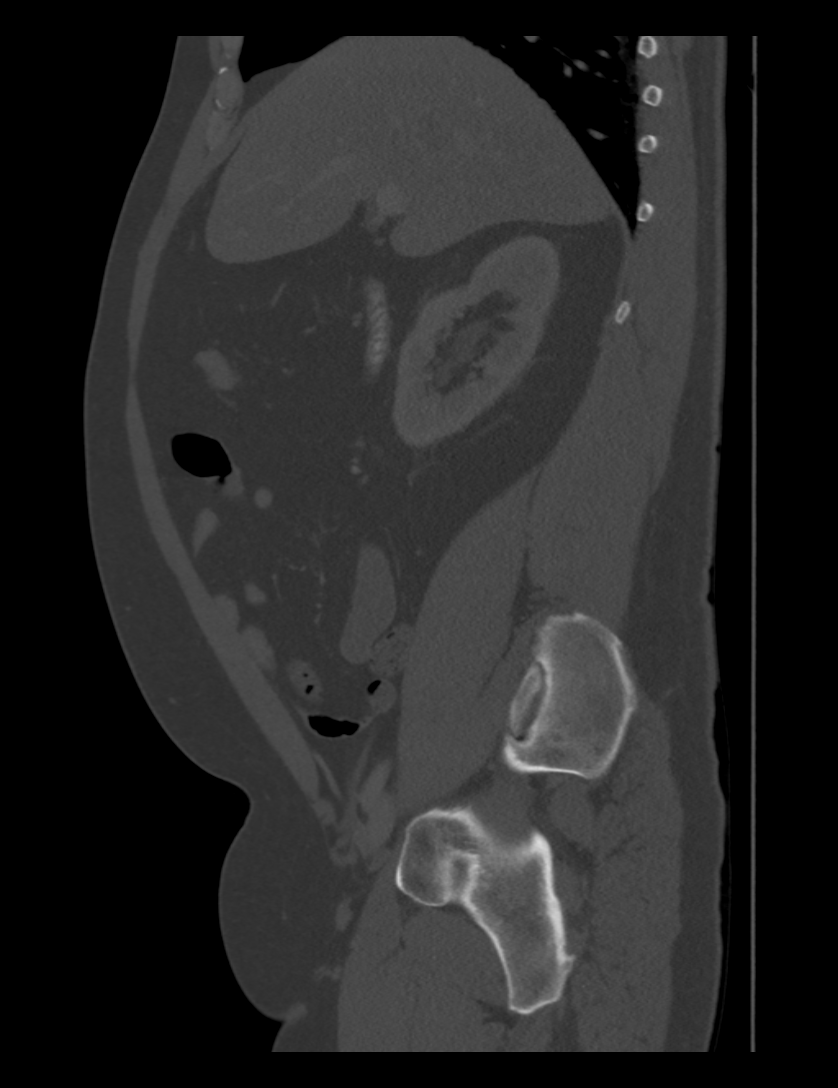

[4 of 46 positions shown; findings below may reference images not displayed]

FINDINGS: Lower chest: Dependent bibasilar atelectasis but no pleural effusion
or pulmonary lesion. The heart is normal in size. No pericardial
effusion. The distal esophagus is grossly normal.

Hepatobiliary: Areas of contrast enhancement noted in segment 7 and
5 of the liver could reflect a flash filling hemangiomas, vascular
shunts or focal fatty sparing. Diffuse fatty infiltration of the
liver is noted. There are several small low-attenuation lesions
which are too small to accurately characterize but are likely benign
cysts. The gallbladder is normal. No common bile duct dilatation.

Pancreas: Normal

Spleen: Normal

Adrenals/Urinary Tract: The adrenal glands are normal. The left
kidney is normal. There is right-sided hydroureteronephrosis down to
an obstructing 4 x 4 mm calculus in the distal ureter proximal to
the UVJ. No bladder calculi.

Stomach/Bowel: The stomach, duodenum, small bowel and colon are
unremarkable. No inflammatory changes, mass lesions or obstructive
findings. The terminal ileum is normal. The appendix is normal.

Vascular/Lymphatic: No mesenteric or retroperitoneal mass or
adenopathy. The aorta and branch vessels are normal. The major
venous structures are patent.

Other: The bladder, prostate gland and seminal vesicles are
unremarkable. No pelvic mass or adenopathy. No free pelvic fluid
collections. No inguinal mass or adenopathy.

Musculoskeletal: No significant bony findings.
IMPRESSION: 4 mm right distal ureteral calculus with moderate
hydroureteronephrosis.

Benign-appearing liver lesions as discussed above.

## 2015-09-30 MED ORDER — TRIAMCINOLONE ACETONIDE 0.1 % EX CREA: 1.0000 "application " | TOPICAL_CREAM | Freq: Two times a day (BID) | CUTANEOUS | Status: DC

## 2015-09-30 NOTE — Patient Instructions (Signed)
It is important that you exercise regularly, at least 20 minutes 3 to 4 times per week.  If you develop chest pain or shortness of breath seek  medical attention.  You need to lose weight.  Consider a lower calorie diet and regular exercise.  Return in one year for follow-up   

## 2015-09-30 NOTE — Progress Notes (Signed)
  Subjective:    Patient ID: [name], male    DOB: Jan 30, 1961, 55 y.o.   MRN: [id number]  HPI   Wt Readings from Last 3 Encounters:  09/30/15 227 lb (102.967 kg)  10/19/14 223 lb (101.152 kg)  04/05/14 224 lb (101.606 kg)    History of Present Illness:   55  year-old patient seen today for an annual examination.  He has relocated from [place] a few years ago, but is a [place] native;  and has enjoyed excellent health. He's had no prior hospital admissions and has no medical illnesses. He takes no chronic medications   Problems Prior to Update:  None   Medications Prior to Update:  1) None   Allergies (verified):  No Known Drug Allergies   Past History:  Past Medical History:  Colonic polyps Renal colic February [id number]  Past Surgical History:  unremarkable  Colonoscopy January 2014  Family History:   father died at age 37, H. MI, history tobacco use, and alcoholism  mother, age 81, in good health  One sister is well   Social History:   Married  two sons, age 68 and 16  [employer] insurance agent   Review of Systems  Constitutional: Negative for fever, chills, activity change, appetite change and fatigue.  HENT: Negative for congestion, dental problem, ear pain, hearing loss, mouth sores, rhinorrhea, sinus pressure, sneezing, tinnitus, trouble swallowing and voice change.   Eyes: Negative for photophobia, pain, redness and visual disturbance.  Respiratory: Negative for apnea, cough, choking, chest tightness, shortness of breath and wheezing.   Cardiovascular: Negative for chest pain, palpitations and leg swelling.  Gastrointestinal: Negative for nausea, vomiting, abdominal pain, diarrhea, constipation, blood in stool, abdominal distention, anal bleeding and rectal pain.  Genitourinary: Negative for dysuria, urgency, frequency, hematuria, flank pain, decreased urine volume, discharge, penile swelling, scrotal swelling, difficulty urinating, genital sores and  testicular pain.  Musculoskeletal: Negative for myalgias, back pain, joint swelling, arthralgias, gait problem, neck pain and neck stiffness.  Skin: Negative for color change, rash and wound.  Neurological: Negative for dizziness, tremors, seizures, syncope, facial asymmetry, speech difficulty, weakness, light-headedness, numbness and headaches.  Hematological: Negative for adenopathy. Does not bruise/bleed easily.  Psychiatric/Behavioral: Negative for suicidal ideas, hallucinations, behavioral problems, confusion, sleep disturbance, self-injury, dysphoric mood, decreased concentration and agitation. The patient is not nervous/anxious.        Objective:   Physical Exam  Constitutional: He is oriented to person, place, and time. He appears well-developed.  HENT:  Head: Normocephalic.  Right Ear: External ear normal.  Left Ear: External ear normal.  Eyes: Conjunctivae and EOM are normal.  Neck: Normal range of motion.  Cardiovascular: Normal rate and normal heart sounds.   Pulmonary/Chest: Breath sounds normal.  Abdominal: Bowel sounds are normal.  Musculoskeletal: Normal range of motion. He exhibits no edema or tenderness.  Neurological: He is alert and oriented to person, place, and time.  Skin: Rash noted.  Patchy areas of dermatitis involving the left medial ankle and the sacral area.  Psychiatric: He has a normal mood and affect. His behavior is normal.          Assessment & Plan:   Preventive health exam Mild exogenous obesity History of colonic polyps.  Followup colonoscopy 2019 History renal colic  Weight loss exercise all encouraged  Recheck 1 year

## 2015-09-30 NOTE — Progress Notes (Signed)
Pre visit review using our clinic review tool, if applicable. No additional management support is needed unless otherwise documented below in the visit note. 

## 2016-09-28 ENCOUNTER — Other Ambulatory Visit: Payer: BLUE CROSS/BLUE SHIELD

## 2016-10-05 ENCOUNTER — Encounter: Payer: BLUE CROSS/BLUE SHIELD | Admitting: Internal Medicine

## 2017-06-03 ENCOUNTER — Encounter: Payer: Self-pay | Admitting: Internal Medicine

## 2017-07-28 ENCOUNTER — Ambulatory Visit: Payer: Managed Care, Other (non HMO) | Admitting: Family Medicine

## 2017-07-28 ENCOUNTER — Encounter: Payer: Self-pay | Admitting: Family Medicine

## 2017-07-28 VITALS — BP 110/80 | HR 67 | Temp 98.0°F | Wt 214.7 lb

## 2017-07-28 DIAGNOSIS — J069 Acute upper respiratory infection, unspecified: Secondary | ICD-10-CM | POA: Diagnosis not present

## 2017-07-28 MED ORDER — BENZONATATE 100 MG PO CAPS: 100.0000 mg | ORAL_CAPSULE | Freq: Two times a day (BID) | ORAL | 0 refills | Status: DC | PRN

## 2017-07-28 NOTE — Patient Instructions (Addendum)
Delsym is an over the counter 12 hour cough medicine that should help you.  I have also sent a prescription for a cough medicine to your pharmacy that you can take to help with your cough at night.   Viral Respiratory Infection A respiratory infection is an illness that affects part of the respiratory system, such as the lungs, nose, or throat. Most respiratory infections are caused by either viruses or bacteria. A respiratory infection that is caused by a virus is called a viral respiratory infection. Common types of viral respiratory infections include:  A cold.  The flu (influenza).  A respiratory syncytial virus (RSV) infection.  How do I know if I have a viral respiratory infection? Most viral respiratory infections cause:  A stuffy or runny nose.  Yellow or green nasal discharge.  A cough.  Sneezing.  Fatigue.  Achy muscles.  A sore throat.  Sweating or chills.  A fever.  A headache.  How are viral respiratory infections treated? If influenza is diagnosed early, it may be treated with an antiviral medicine that shortens the length of time a person has symptoms. Symptoms of viral respiratory infections may be treated with over-the-counter and prescription medicines, such as:  Expectorants. These make it easier to cough up mucus.  Decongestant nasal sprays.  Health care providers do not prescribe antibiotic medicines for viral infections. This is because antibiotics are designed to kill bacteria. They have no effect on viruses. How do I know if I should stay home from work or school? To avoid exposing others to your respiratory infection, stay home if you have:  A fever.  A persistent cough.  A sore throat.  A runny nose.  Sneezing.  Muscles aches.  Headaches.  Fatigue.  Weakness.  Chills.  Sweating.  Nausea.  Follow these instructions at home:  Rest as much as possible.  Take over-the-counter and prescription medicines only as told by  your health care provider.  Drink enough fluid to keep your urine clear or pale yellow. This helps prevent dehydration and helps loosen up mucus.  Gargle with a salt-water mixture 3-4 times per day or as needed. To make a salt-water mixture, completely dissolve -1 tsp of salt in 1 cup of warm water.  Use nose drops made from salt water to ease congestion and soften raw skin around your nose.  Do not drink alcohol.  Do not use tobacco products, including cigarettes, chewing tobacco, and e-cigarettes. If you need help quitting, ask your health care provider. Contact a health care provider if:  Your symptoms last for 10 days or longer.  Your symptoms get worse over time.  You have a fever.  You have severe sinus pain in your face or forehead.  The glands in your jaw or neck become very swollen. Get help right away if:  You feel pain or pressure in your chest.  You have shortness of breath.  You faint or feel like you will faint.  You have severe and persistent vomiting.  You feel confused or disoriented. This information is not intended to replace advice given to you by your health care provider. Make sure you discuss any questions you have with your health care provider. Document Released: 06/10/2005 Document Revised: 02/06/2016 Document Reviewed: 02/06/2015 Elsevier Interactive Patient Education  2017 [employer].

## 2017-07-28 NOTE — Progress Notes (Signed)
Subjective:    Patient ID: [name], male    DOB: Aug 14, 1961, 56 y.o.   MRN: [id number]  Chief Complaint  Patient presents with  . Cough    started last weekend   . Nasal Congestion    HPI Patient was seen today for acute concern.  Nasal/chest congestion, sore throat, cough since last Sunday.  Patient endorses subjective fever.  Patient denies rhinorrhea, ear/face pain.  Sick contacts include coworker.  Patient has tried NyQuil.  Pt does endorse seasonal allergies, typically affect him in the Spring, will take Allegra.  History reviewed. No pertinent past medical history.  No Known Allergies  ROS General: Denies fever, chills, night sweats, changes in weight, changes in appetite  +subjective fever HEENT: Denies headaches, ear pain, changes in vision, rhinorrhea   +sore throat, nasal and chest congestion CV: Denies CP, palpitations, SOB, orthopnea Pulm: Denies SOB, wheezing    +cough GI: Denies abdominal pain, nausea, vomiting, diarrhea, constipation GU: Denies dysuria, hematuria, frequency, vaginal discharge Msk: Denies muscle cramps, joint pains Neuro: Denies weakness, numbness, tingling Skin: Denies rashes, bruising Psych: Denies depression, anxiety, hallucinations     Objective:    Blood pressure 110/80, pulse 67, temperature 98 F (36.7 C), temperature source Oral, weight 214 lb 11.2 oz (97.4 kg), SpO2 97 %.   Gen. Pleasant, well-nourished, in no distress, normal affect   HEENT: [place]/AT, face symmetric, no scleral icterus, PERRLA, nares patent without drainage, pharynx without erythema, some post nasal drainage, no exudate. Lungs: no accessory muscle use, CTAB, no wheezes or rales.  No coughing appreciated during exam. Cardiovascular: RRR, no m/r/g, no peripheral edema Abdomen: BS present, soft, NT/ND. Neuro:  A&Ox3, CN II-XII intact, normal gait   Wt Readings from Last 3 Encounters:  07/28/17 214 lb 11.2 oz (97.4 kg)  09/30/15 227 lb (103 kg)  10/19/14 223  lb (101.2 kg)    Lab Results  Component Value Date   WBC 6.0 09/25/2015   HGB 15.4 09/25/2015   HCT 46.2 09/25/2015   PLT 297.0 09/25/2015   GLUCOSE 97 09/25/2015   CHOL 195 09/25/2015   TRIG 199.0 (H) 09/25/2015   HDL 47.50 09/25/2015   LDLCALC 108 (H) 09/25/2015   ALT 32 09/25/2015   AST 17 09/25/2015   NA 141 09/25/2015   K 5.0 09/25/2015   CL 104 09/25/2015   CREATININE 0.98 09/25/2015   BUN 19 09/25/2015   CO2 31 09/25/2015   TSH 2.68 09/25/2015   PSA 0.65 09/25/2015    Assessment/Plan:  URI with cough and congestion -Supportive care -Encouraged to drink warm liquids, ok to take honey for cough, discussed use of Delsym. -Given RTC precautions and handout on URI. - Plan: benzonatate (TESSALON) 100 MG capsule   F/u later this wk prn for continued or worse symptoms.

## 2017-09-01 ENCOUNTER — Ambulatory Visit (INDEPENDENT_AMBULATORY_CARE_PROVIDER_SITE_OTHER): Payer: Managed Care, Other (non HMO) | Admitting: Internal Medicine

## 2017-09-01 ENCOUNTER — Encounter: Payer: Self-pay | Admitting: Internal Medicine

## 2017-09-01 VITALS — BP 138/80 | HR 71 | Temp 97.8°F | Ht 69.0 in | Wt 214.0 lb

## 2017-09-01 DIAGNOSIS — Z0000 Encounter for general adult medical examination without abnormal findings: Secondary | ICD-10-CM | POA: Diagnosis not present

## 2017-09-01 MED ORDER — TRIAMCINOLONE ACETONIDE 0.1 % EX CREA: 1.0000 "application " | TOPICAL_CREAM | Freq: Two times a day (BID) | CUTANEOUS | 3 refills | Status: DC

## 2017-09-01 NOTE — Patient Instructions (Addendum)
Limit your sodium (Salt) intake    It is important that you exercise regularly, at least 20 minutes 3 to 4 times per week.  If you develop chest pain or shortness of breath seek  medical attention.  You need to lose weight.  Consider a lower calorie diet and regular exercise.   DASH Eating Plan DASH stands for "Dietary Approaches to Stop Hypertension." The DASH eating plan is a healthy eating plan that has been shown to reduce high blood pressure (hypertension). It may also reduce your risk for type 2 diabetes, heart disease, and stroke. The DASH eating plan may also help with weight loss. What are tips for following this plan? General guidelines  Avoid eating more than 2,300 mg (milligrams) of salt (sodium) a day. If you have hypertension, you may need to reduce your sodium intake to 1,500 mg a day.  Limit alcohol intake to no more than 1 drink a day for nonpregnant women and 2 drinks a day for men. One drink equals 12 oz of beer, 5 oz of wine, or 1 oz of hard liquor.  Work with your health care provider to maintain a healthy body weight or to lose weight. Ask what an ideal weight is for you.  Get at least 30 minutes of exercise that causes your heart to beat faster (aerobic exercise) most days of the week. Activities may include walking, swimming, or biking.  Work with your health care provider or diet and nutrition specialist (dietitian) to adjust your eating plan to your individual calorie needs. Reading food labels  Check food labels for the amount of sodium per serving. Choose foods with less than 5 percent of the Daily Value of sodium. Generally, foods with less than 300 mg of sodium per serving fit into this eating plan.  To find whole grains, look for the word "whole" as the first word in the ingredient list. Shopping  Buy products labeled as "low-sodium" or "no salt added."  Buy fresh foods. Avoid canned foods and premade or frozen meals. Cooking  Avoid adding salt when  cooking. Use salt-free seasonings or herbs instead of table salt or sea salt. Check with your health care provider or pharmacist before using salt substitutes.  Do not fry foods. Cook foods using healthy methods such as baking, boiling, grilling, and broiling instead.  Cook with heart-healthy oils, such as olive, canola, soybean, or sunflower oil. Meal planning   Eat a balanced diet that includes: ? 5 or more servings of fruits and vegetables each day. At each meal, try to fill half of your plate with fruits and vegetables. ? Up to 6-8 servings of whole grains each day. ? Less than 6 oz of lean meat, poultry, or fish each day. A 3-oz serving of meat is about the same size as a deck of cards. One egg equals 1 oz. ? 2 servings of low-fat dairy each day. ? A serving of nuts, seeds, or beans 5 times each week. ? Heart-healthy fats. Healthy fats called Omega-3 fatty acids are found in foods such as flaxseeds and coldwater fish, like sardines, salmon, and mackerel.  Limit how much you eat of the following: ? Canned or prepackaged foods. ? Food that is high in trans fat, such as fried foods. ? Food that is high in saturated fat, such as fatty meat. ? Sweets, desserts, sugary drinks, and other foods with added sugar. ? Full-fat dairy products.  Do not salt foods before eating.  Try to eat at least  2 vegetarian meals each week.  Eat more home-cooked food and less restaurant, buffet, and fast food.  When eating at a restaurant, ask that your food be prepared with less salt or no salt, if possible. What foods are recommended? The items listed may not be a complete list. Talk with your dietitian about what dietary choices are best for you. Grains Whole-grain or whole-wheat bread. Whole-grain or whole-wheat pasta. Brown rice. [place]. Bulgur. Whole-grain and low-sodium cereals. Pita bread. Low-fat, low-sodium crackers. Whole-wheat flour tortillas. Vegetables Fresh or frozen vegetables  (raw, steamed, roasted, or grilled). Low-sodium or reduced-sodium tomato and vegetable juice. Low-sodium or reduced-sodium tomato sauce and tomato paste. Low-sodium or reduced-sodium canned vegetables. Fruits All fresh, dried, or frozen fruit. Canned fruit in natural juice (without added sugar). Meat and other protein foods Skinless chicken or [place]. Ground chicken or [place]. Pork with fat trimmed off. Fish and seafood. Egg whites. Dried beans, peas, or lentils. Unsalted nuts, nut butters, and seeds. Unsalted canned beans. Lean cuts of beef with fat trimmed off. Low-sodium, lean deli meat. Dairy Low-fat (1%) or fat-free (skim) milk. Fat-free, low-fat, or reduced-fat cheeses. Nonfat, low-sodium ricotta or cottage cheese. Low-fat or nonfat yogurt. Low-fat, low-sodium cheese. Fats and oils Soft margarine without trans fats. Vegetable oil. Low-fat, reduced-fat, or light mayonnaise and salad dressings (reduced-sodium). Canola, safflower, olive, soybean, and sunflower oils. Avocado. Seasoning and other foods Herbs. Spices. Seasoning mixes without salt. Unsalted popcorn and pretzels. Fat-free sweets. What foods are not recommended? The items listed may not be a complete list. Talk with your dietitian about what dietary choices are best for you. Grains Baked goods made with fat, such as croissants, muffins, or some breads. Dry pasta or rice meal packs. Vegetables Creamed or fried vegetables. Vegetables in a cheese sauce. Regular canned vegetables (not low-sodium or reduced-sodium). Regular canned tomato sauce and paste (not low-sodium or reduced-sodium). Regular tomato and vegetable juice (not low-sodium or reduced-sodium). Angie Fava. Olives. Fruits Canned fruit in a light or heavy syrup. Fried fruit. Fruit in cream or butter sauce. Meat and other protein foods Fatty cuts of meat. Ribs. Fried meat. Berniece Salines. Sausage. Bologna and other processed lunch meats. Salami. Fatback. Hotdogs. Bratwurst. Salted nuts  and seeds. Canned beans with added salt. Canned or smoked fish. Whole eggs or egg yolks. Chicken or [place] with skin. Dairy Whole or 2% milk, cream, and half-and-half. Whole or full-fat cream cheese. Whole-fat or sweetened yogurt. Full-fat cheese. Nondairy creamers. Whipped toppings. Processed cheese and cheese spreads. Fats and oils Butter. Stick margarine. Lard. Shortening. Ghee. Bacon fat. Tropical oils, such as coconut, palm kernel, or palm oil. Seasoning and other foods Salted popcorn and pretzels. Onion salt, garlic salt, seasoned salt, table salt, and sea salt. Worcestershire sauce. Tartar sauce. Barbecue sauce. Teriyaki sauce. Soy sauce, including reduced-sodium. Steak sauce. Canned and packaged gravies. Fish sauce. Oyster sauce. Cocktail sauce. Horseradish that you find on the shelf. Ketchup. Mustard. Meat flavorings and tenderizers. Bouillon cubes. Hot sauce and Tabasco sauce. Premade or packaged marinades. Premade or packaged taco seasonings. Relishes. Regular salad dressings. Where to find more information:  National Heart, Lung, and [place]: https://wilson-eaton.com/  American Heart Association: www.heart.org Summary  The DASH eating plan is a healthy eating plan that has been shown to reduce high blood pressure (hypertension). It may also reduce your risk for type 2 diabetes, heart disease, and stroke.  With the DASH eating plan, you should limit salt (sodium) intake to 2,300 mg a day. If you have hypertension, you may  need to reduce your sodium intake to 1,500 mg a day.  When on the DASH eating plan, aim to eat more fresh fruits and vegetables, whole grains, lean proteins, low-fat dairy, and heart-healthy fats.  Work with your health care provider or diet and nutrition specialist (dietitian) to adjust your eating plan to your individual calorie needs.   Schedule your colonoscopy to help detect colon cancer.

## 2017-09-01 NOTE — Progress Notes (Signed)
Subjective:    Patient ID: [name], male    DOB: 05-Dec-1960, 56 y.o.   MRN: [id number]  HPI 56 year old patient who is seen today for a preventive health examination. He is doing quite well without concerns or complaints.  Family history is unchanged.  His father is deceased but mother and sister still doing quite well  He has been much more active over the past year with tennis and has had a nice weight loss.  He does have a history of colonic polyps and is due for his 5-year colonoscopy next year.  This will be scheduled  History reviewed. No pertinent past medical history.   Social History   Socioeconomic History  . Marital status: Married    Spouse name: Not on file  . Number of children: Not on file  . Years of education: Not on file  . Highest education level: Not on file  Social Needs  . Financial resource strain: Not on file  . Food insecurity - worry: Not on file  . Food insecurity - inability: Not on file  . Transportation needs - medical: Not on file  . Transportation needs - non-medical: Not on file  Occupational History  . Not on file  Tobacco Use  . Smoking status: Never Smoker  . Smokeless tobacco: Never Used  Substance and Sexual Activity  . Alcohol use: Yes    Comment: rarely  . Drug use: No  . Sexual activity: Not on file  Other Topics Concern  . Not on file  Social History Narrative  . Not on file    History reviewed. No pertinent surgical history.  Family History  Problem Relation Age of Onset  . Alcohol abuse Father   . Colon cancer Neg Hx   . Esophageal cancer Neg Hx   . Stomach cancer Neg Hx   . Rectal cancer Neg Hx     No Known Allergies  Current Outpatient Medications on File Prior to Visit  Medication Sig Dispense Refill  . ibuprofen (ADVIL) 200 MG tablet Take 400 mg by mouth as needed.     No current facility-administered medications on file prior to visit.     BP 138/80 (BP Location: Left Arm, Patient Position:  Sitting, Cuff Size: Normal)   Pulse 71   Temp 97.8 F (36.6 C) (Oral)   Ht 5\' 9"  (1.753 m)   Wt 214 lb (97.1 kg)   SpO2 98%   BMI 31.60 kg/m      Review of Systems  Constitutional: Negative for appetite change, chills, fatigue and fever.  HENT: Negative for congestion, dental problem, ear pain, hearing loss, sore throat, tinnitus, trouble swallowing and voice change.   Eyes: Negative for pain, discharge and visual disturbance.  Respiratory: Negative for cough, chest tightness, wheezing and stridor.   Cardiovascular: Negative for chest pain, palpitations and leg swelling.  Gastrointestinal: Negative for abdominal distention, abdominal pain, blood in stool, constipation, diarrhea, nausea and vomiting.  Genitourinary: Negative for difficulty urinating, discharge, flank pain, genital sores, hematuria and urgency.  Musculoskeletal: Negative for arthralgias, back pain, gait problem, joint swelling, myalgias and neck stiffness.  Skin: Positive for rash.  Neurological: Negative for dizziness, syncope, speech difficulty, weakness, numbness and headaches.  Hematological: Negative for adenopathy. Does not bruise/bleed easily.  Psychiatric/Behavioral: Negative for behavioral problems and dysphoric mood. The patient is not nervous/anxious.        Objective:   Physical Exam  Constitutional: He appears well-developed and well-nourished.  Blood pressure 130/80  Weight 214  HENT:  Head: Normocephalic and atraumatic.  Right Ear: External ear normal.  Left Ear: External ear normal.  Nose: Nose normal.  Mouth/Throat: Oropharynx is clear and moist.  Eyes: Conjunctivae and EOM are normal. Pupils are equal, round, and reactive to light. No scleral icterus.  Neck: Normal range of motion. Neck supple. No JVD present. No thyromegaly present.  Cardiovascular: Regular rhythm, normal heart sounds and intact distal pulses. Exam reveals no gallop and no friction rub.  No murmur heard. Pulmonary/Chest:  Effort normal and breath sounds normal. He exhibits no tenderness.  Abdominal: Soft. Bowel sounds are normal. He exhibits no distension and no mass. There is no tenderness.  Genitourinary: Prostate normal and penis normal.  Musculoskeletal: Normal range of motion. He exhibits no edema or tenderness.  Lymphadenopathy:    He has no cervical adenopathy.  Neurological: He is alert. He has normal reflexes. No cranial nerve deficit. Coordination normal.  Skin: Skin is warm and dry. No rash noted.  Dry scaly chronic appearing rash involving the left lower inner leg [place] proximal to the ankle  Psychiatric: He has a normal mood and affect. His behavior is normal.          Assessment & Plan:   Preventive health examination  DASH diet F/u colonoscopy Review lab   Nyoka Cowden

## 2017-10-08 ENCOUNTER — Encounter: Payer: Self-pay | Admitting: *Deleted

## 2017-10-19 ENCOUNTER — Encounter: Payer: Self-pay | Admitting: Internal Medicine

## 2017-11-02 ENCOUNTER — Encounter: Payer: Self-pay | Admitting: Internal Medicine

## 2018-03-07 ENCOUNTER — Telehealth: Payer: Self-pay | Admitting: *Deleted

## 2018-03-07 NOTE — Telephone Encounter (Signed)
Copied from [place] [id number]. Topic: Referral - Request >> Mar 04, 2018  4:11 PM Vernona Rieger wrote: Reason for CRM: Patient is ready to have a colonoscopy. He said he did have a referral but never went and had it done. He needs orders/referral from Dr. Raliegh Ip to Dr Earlean Shawl at [facility]. He would like to have this done in July or early August. Call back @ [phone]

## 2018-03-08 NOTE — Telephone Encounter (Signed)
Okay for referral for colonoscopy.

## 2018-03-08 NOTE — Telephone Encounter (Signed)
Colonoscopy scheduled.

## 2018-03-08 NOTE — Telephone Encounter (Signed)
Okay for referral?

## 2018-05-04 ENCOUNTER — Encounter: Payer: Managed Care, Other (non HMO) | Admitting: Internal Medicine

## 2018-09-23 ENCOUNTER — Ambulatory Visit: Payer: Managed Care, Other (non HMO) | Admitting: Internal Medicine

## 2018-09-23 ENCOUNTER — Other Ambulatory Visit: Payer: Self-pay | Admitting: Internal Medicine

## 2018-09-23 ENCOUNTER — Encounter: Payer: Self-pay | Admitting: Internal Medicine

## 2018-09-23 VITALS — BP 120/80 | HR 73 | Temp 98.2°F | Ht 69.0 in | Wt 224.5 lb

## 2018-09-23 DIAGNOSIS — Z0000 Encounter for general adult medical examination without abnormal findings: Secondary | ICD-10-CM | POA: Diagnosis not present

## 2018-09-23 DIAGNOSIS — Z114 Encounter for screening for human immunodeficiency virus [HIV]: Secondary | ICD-10-CM

## 2018-09-23 DIAGNOSIS — E559 Vitamin D deficiency, unspecified: Secondary | ICD-10-CM

## 2018-09-23 DIAGNOSIS — Z1159 Encounter for screening for other viral diseases: Secondary | ICD-10-CM | POA: Diagnosis not present

## 2018-09-23 DIAGNOSIS — H6122 Impacted cerumen, left ear: Secondary | ICD-10-CM

## 2018-09-23 LAB — COMPREHENSIVE METABOLIC PANEL
ALT: 50 U/L (ref 0–53)
AST: 22 U/L (ref 0–37)
Albumin: 4.1 g/dL (ref 3.5–5.2)
Alkaline Phosphatase: 48 U/L (ref 39–117)
BUN: 13 mg/dL (ref 6–23)
CO2: 28 meq/L (ref 19–32)
CREATININE: 0.98 mg/dL (ref 0.40–1.50)
Calcium: 9.2 mg/dL (ref 8.4–10.5)
Chloride: 105 mEq/L (ref 96–112)
GFR: 83.82 mL/min (ref >60.00)
Glucose, Bld: 99 mg/dL (ref 70–99)
Potassium: 4.8 mEq/L (ref 3.5–5.1)
Sodium: 141 mEq/L (ref 135–145)
Total Bilirubin: 0.6 mg/dL (ref 0.2–1.2)
Total Protein: 6.3 g/dL (ref 6.0–8.3)

## 2018-09-23 LAB — CBC WITH DIFFERENTIAL/PLATELET
Basophils Absolute: 0.1 10*3/uL (ref 0.0–0.1)
Basophils Relative: 1 % (ref 0.0–3.0)
Eosinophils Absolute: 0.2 10*3/uL (ref 0.0–0.7)
Eosinophils Relative: 2.2 % (ref 0.0–5.0)
HCT: 45 % (ref 39.0–52.0)
Hemoglobin: 15.5 g/dL (ref 13.0–17.0)
LYMPHS ABS: 2.5 10*3/uL (ref 0.7–4.0)
Lymphocytes Relative: 32.6 % (ref 12.0–46.0)
MCHC: 34.5 g/dL (ref 30.0–36.0)
MCV: 86.1 fl (ref 78.0–100.0)
Monocytes Absolute: 0.7 10*3/uL (ref 0.1–1.0)
Monocytes Relative: 9.4 % (ref 3.0–12.0)
NEUTROS ABS: 4.2 10*3/uL (ref 1.4–7.7)
Neutrophils Relative %: 54.8 % (ref 43.0–77.0)
Platelets: 253 10*3/uL (ref 150.0–400.0)
RBC: 5.22 Mil/uL (ref 4.22–5.81)
RDW: 13 % (ref 11.5–15.5)
WBC: 7.6 10*3/uL (ref 4.0–10.5)

## 2018-09-23 LAB — LIPID PANEL
Cholesterol: 177 mg/dL (ref 0–200)
HDL: 43.6 mg/dL (ref >39.00)
LDL Cholesterol: 114 mg/dL — ABNORMAL HIGH (ref 0–99)
NonHDL: 132.98
Total CHOL/HDL Ratio: 4
Triglycerides: 96 mg/dL (ref 0.0–149.0)
VLDL: 19.2 mg/dL (ref 0.0–40.0)

## 2018-09-23 LAB — TSH: TSH: 2.42 u[IU]/mL (ref 0.35–4.50)

## 2018-09-23 LAB — VITAMIN D 25 HYDROXY (VIT D DEFICIENCY, FRACTURES): VITD: 18.36 ng/mL — ABNORMAL LOW (ref 30.00–100.00)

## 2018-09-23 MED ORDER — VITAMIN D (ERGOCALCIFEROL) 1.25 MG (50000 UNIT) PO CAPS: 50000.0000 [IU] | ORAL_CAPSULE | ORAL | 0 refills | Status: AC

## 2018-09-23 NOTE — Progress Notes (Signed)
Established Patient Office Visit     CC/Reason for Visit: Establish care, annual physical exam  HPI: [name] is a 58 y.o. male who is coming in today for the above mentioned reasons.  No past medical history of significance.  He works as an Medical illustrator, plays tennis routinely for physical activity, his New Year's resolution is to lose 10 pounds.  He has routine eye and dental care.  He is due for his repeat colonoscopy, he will call the GI office to schedule.  Will receive screening labs today.  We have discussed immunization status, he declines flu vaccine, is up-to-date on tetanus, we have also discussed shingles vaccine which he appears to be interested in but will think about it.  He does not smoke, drinks alcohol occasionally maybe twice a month.  Past Medical/Surgical History: No past medical history on file.  No past surgical history on file.  Social History:  reports that he has never smoked. He has never used smokeless tobacco. He reports current alcohol use. He reports that he does not use drugs.  Allergies: No Known Allergies  Family History:  Family History  Problem Relation Age of Onset  . Alcohol abuse Father   . Colon cancer Neg Hx   . Esophageal cancer Neg Hx   . Stomach cancer Neg Hx   . Rectal cancer Neg Hx      Current Outpatient Medications:  .  ibuprofen (ADVIL) 200 MG tablet, Take 400 mg by mouth as needed., Disp: , Rfl:  .  triamcinolone cream (KENALOG) 0.1 %, Apply 1 application topically 2 (two) times daily., Disp: 80 g, Rfl: 3  Review of Systems:  Constitutional: Denies fever, chills, diaphoresis, appetite change and fatigue.  HEENT: Denies photophobia, eye pain, redness, hearing loss, ear pain, congestion, sore throat, rhinorrhea, sneezing, mouth sores, trouble swallowing, neck pain, neck stiffness and tinnitus.   Respiratory: Denies SOB, DOE, cough, chest tightness,  and wheezing.   Cardiovascular: Denies chest pain,  palpitations and leg swelling.  Gastrointestinal: Denies nausea, vomiting, abdominal pain, diarrhea, constipation, blood in stool and abdominal distention.  Genitourinary: Denies dysuria, urgency, frequency, hematuria, flank pain and difficulty urinating.  Endocrine: Denies: hot or cold intolerance, sweats, changes in hair or nails, polyuria, polydipsia. Musculoskeletal: Denies myalgias, back pain, joint swelling, arthralgias and gait problem.  Skin: Denies pallor, rash and wound.  Neurological: Denies dizziness, seizures, syncope, weakness, light-headedness, numbness and headaches.  Hematological: Denies adenopathy. Easy bruising, personal or family bleeding history  Psychiatric/Behavioral: Denies suicidal ideation, mood changes, confusion, nervousness, sleep disturbance and agitation    Physical Exam: Vitals:   09/23/18 0837  BP: 120/80  Pulse: 73  Temp: 98.2 F (36.8 C)  TempSrc: Oral  SpO2: 96%  Weight: 224 lb 8 oz (101.8 kg)  Height: 5' 9" (1.753 m)    Body mass index is 33.15 kg/m.   Constitutional: NAD, calm, comfortable Eyes: PERRL, lids and conjunctivae normal ENMT: Mucous membranes are moist. Posterior pharynx clear of any exudate or lesions. Normal dentition.  Right ttympanic membrane is pearly white, no erythema or bulging, left has cerumen impaction. Neck: normal, supple, no masses, no thyromegaly Respiratory: clear to auscultation bilaterally, no wheezing, no crackles. Normal respiratory effort. No accessory muscle use.  Cardiovascular: Regular rate and rhythm, no murmurs / rubs / gallops. No extremity edema. 2+ pedal pulses. No carotid bruits.  Abdomen: no tenderness, no masses palpated. No hepatosplenomegaly. Bowel sounds positive.  Musculoskeletal: no clubbing / cyanosis. No joint  deformity upper and lower extremities. Good ROM, no contractures. Normal muscle tone.  Skin: no rashes, lesions, ulcers. No induration Neurologic: CN 2-12 grossly intact. Sensation  intact, DTR normal. Strength 5/5 in all 4.  Psychiatric: Normal judgment and insight. Alert and oriented x 3. Normal mood.    Impression and Plan:  Encounter for preventive health examination -Screening labs to be obtained today including CBC with differential, comprehensive metabolic profile, vitamin D, TSH. -Discussed immunization status, he declines flu, will think about shingles, is up-to-date on tetanus. -He gets routine eye and dental care. -He will schedule repeat colonoscopy which he is due for.  Encounter for screening for HIV - Plan: HIV antibody (with reflex)  Need for hepatitis C screening test - Plan: Hep C Antibody  Impacted cerumen of left ear -Recommended ear irrigation, he prefers to use Debrox twice daily and will return if continues to have issues.    Patient Instructions  -It was nice meeting you today!  -Lab work today. We will notify you when results are available.   Preventive Care 40-64 Years, Male Preventive care refers to lifestyle choices and visits with your health care provider that can promote health and wellness. What does preventive care include?   A yearly physical exam. This is also called an annual well check.  Dental exams once or twice a year.  Routine eye exams. Ask your health care provider how often you should have your eyes checked.  Personal lifestyle choices, including: ? Daily care of your teeth and gums. ? Regular physical activity. ? Eating a healthy diet. ? Avoiding tobacco and drug use. ? Limiting alcohol use. ? Practicing safe sex. ? Taking low-dose aspirin every day starting at age 50. What happens during an annual well check? The services and screenings done by your health care provider during your annual well check will depend on your age, overall health, lifestyle risk factors, and family history of disease. Counseling Your health care provider may ask you questions about your:  Alcohol use.  Tobacco  use.  Drug use.  Emotional well-being.  Home and relationship well-being.  Sexual activity.  Eating habits.  Work and work environment. Screening You may have the following tests or measurements:  Height, weight, and BMI.  Blood pressure.  Lipid and cholesterol levels. These may be checked every 5 years, or more frequently if you are over 50 years old.  Skin check.  Lung cancer screening. You may have this screening every year starting at age 55 if you have a 30-pack-year history of smoking and currently smoke or have quit within the past 15 years.  Colorectal cancer screening. All adults should have this screening starting at age 50 and continuing until age 75. Your health care provider may recommend screening at age 45. You will have tests every 1-10 years, depending on your results and the type of screening test. People at increased risk should start screening at an earlier age. Screening tests may include: ? Guaiac-based fecal occult blood testing. ? Fecal immunochemical test (FIT). ? Stool DNA test. ? Virtual colonoscopy. ? Sigmoidoscopy. During this test, a flexible tube with a tiny camera (sigmoidoscope) is used to examine your rectum and lower colon. The sigmoidoscope is inserted through your anus into your rectum and lower colon. ? Colonoscopy. During this test, a long, thin, flexible tube with a tiny camera (colonoscope) is used to examine your entire colon and rectum.  Prostate cancer screening. Recommendations will vary depending on your family history and other   risks.  Hepatitis C blood test.  Hepatitis B blood test.  Sexually transmitted disease (STD) testing.  Diabetes screening. This is done by checking your blood sugar (glucose) after you have not eaten for a while (fasting). You may have this done every 1-3 years. Discuss your test results, treatment options, and if necessary, the need for more tests with your health care provider. Vaccines Your health  care provider may recommend certain vaccines, such as:  Influenza vaccine. This is recommended every year.  Tetanus, diphtheria, and acellular pertussis (Tdap, Td) vaccine. You may need a Td booster every 10 years.  Varicella vaccine. You may need this if you have not been vaccinated.  Zoster vaccine. You may need this after age 60.  Measles, mumps, and rubella (MMR) vaccine. You may need at least one dose of MMR if you were born in 1957 or later. You may also need a second dose.  Pneumococcal 13-valent conjugate (PCV13) vaccine. You may need this if you have certain conditions and have not been vaccinated.  Pneumococcal polysaccharide (PPSV23) vaccine. You may need one or two doses if you smoke cigarettes or if you have certain conditions.  Meningococcal vaccine. You may need this if you have certain conditions.  Hepatitis A vaccine. You may need this if you have certain conditions or if you travel or work in places where you may be exposed to hepatitis A.  Hepatitis B vaccine. You may need this if you have certain conditions or if you travel or work in places where you may be exposed to hepatitis B.  Haemophilus influenzae type b (Hib) vaccine. You may need this if you have certain risk factors. Talk to your health care provider about which screenings and vaccines you need and how often you need them. This information is not intended to replace advice given to you by your health care provider. Make sure you discuss any questions you have with your health care provider. Document Released: 09/27/2015 Document Revised: 10/21/2017 Document Reviewed: 07/02/2015 Elsevier Interactive Patient Education  2019 Elsevier Inc.      Estela Hernandez Acosta, MD [facility]   

## 2018-09-23 NOTE — Patient Instructions (Signed)
-It was nice meeting you today!  -Lab work today. We will notify you when results are available.   Preventive Care 40-64 Years, Male Preventive care refers to lifestyle choices and visits with your health care provider that can promote health and wellness. What does preventive care include?   A yearly physical exam. This is also called an annual well check.  Dental exams once or twice a year.  Routine eye exams. Ask your health care provider how often you should have your eyes checked.  Personal lifestyle choices, including: ? Daily care of your teeth and gums. ? Regular physical activity. ? Eating a healthy diet. ? Avoiding tobacco and drug use. ? Limiting alcohol use. ? Practicing safe sex. ? Taking low-dose aspirin every day starting at age 25. What happens during an annual well check? The services and screenings done by your health care provider during your annual well check will depend on your age, overall health, lifestyle risk factors, and family history of disease. Counseling Your health care provider may ask you questions about your:  Alcohol use.  Tobacco use.  Drug use.  Emotional well-being.  Home and relationship well-being.  Sexual activity.  Eating habits.  Work and work Statistician. Screening You may have the following tests or measurements:  Height, weight, and BMI.  Blood pressure.  Lipid and cholesterol levels. These may be checked every 5 years, or more frequently if you are over 53 years old.  Skin check.  Lung cancer screening. You may have this screening every year starting at age 36 if you have a 30-pack-year history of smoking and currently smoke or have quit within the past 15 years.  Colorectal cancer screening. All adults should have this screening starting at age 35 and continuing until age 75. Your health care provider may recommend screening at age 107. You will have tests every 1-10 years, depending on your results and the type  of screening test. People at increased risk should start screening at an earlier age. Screening tests may include: ? Guaiac-based fecal occult blood testing. ? Fecal immunochemical test (FIT). ? Stool DNA test. ? Virtual colonoscopy. ? Sigmoidoscopy. During this test, a flexible tube with a tiny camera (sigmoidoscope) is used to examine your rectum and lower colon. The sigmoidoscope is inserted through your anus into your rectum and lower colon. ? Colonoscopy. During this test, a long, thin, flexible tube with a tiny camera (colonoscope) is used to examine your entire colon and rectum.  Prostate cancer screening. Recommendations will vary depending on your family history and other risks.  Hepatitis C blood test.  Hepatitis B blood test.  Sexually transmitted disease (STD) testing.  Diabetes screening. This is done by checking your blood sugar (glucose) after you have not eaten for a while (fasting). You may have this done every 1-3 years. Discuss your test results, treatment options, and if necessary, the need for more tests with your health care provider. Vaccines Your health care provider may recommend certain vaccines, such as:  Influenza vaccine. This is recommended every year.  Tetanus, diphtheria, and acellular pertussis (Tdap, Td) vaccine. You may need a Td booster every 10 years.  Varicella vaccine. You may need this if you have not been vaccinated.  Zoster vaccine. You may need this after age 51.  Measles, mumps, and rubella (MMR) vaccine. You may need at least one dose of MMR if you were born in 1957 or later. You may also need a second dose.  Pneumococcal 13-valent conjugate (PCV13)  vaccine. You may need this if you have certain conditions and have not been vaccinated.  Pneumococcal polysaccharide (PPSV23) vaccine. You may need one or two doses if you smoke cigarettes or if you have certain conditions.  Meningococcal vaccine. You may need this if you have certain  conditions.  Hepatitis A vaccine. You may need this if you have certain conditions or if you travel or work in places where you may be exposed to hepatitis A.  Hepatitis B vaccine. You may need this if you have certain conditions or if you travel or work in places where you may be exposed to hepatitis B.  Haemophilus influenzae type b (Hib) vaccine. You may need this if you have certain risk factors. Talk to your health care provider about which screenings and vaccines you need and how often you need them. This information is not intended to replace advice given to you by your health care provider. Make sure you discuss any questions you have with your health care provider. Document Released: 09/27/2015 Document Revised: 10/21/2017 Document Reviewed: 07/02/2015 Elsevier Interactive Patient Education  2019 [employer].

## 2018-09-24 LAB — HEPATITIS C ANTIBODY
Hepatitis C Ab: NONREACTIVE
SIGNAL TO CUT-OFF: 0.01 (ref <1.00)

## 2018-09-24 LAB — HIV ANTIBODY (ROUTINE TESTING W REFLEX): HIV 1&2 Ab, 4th Generation: NONREACTIVE

## 2018-09-27 ENCOUNTER — Other Ambulatory Visit: Payer: Self-pay | Admitting: Internal Medicine

## 2018-09-27 DIAGNOSIS — E559 Vitamin D deficiency, unspecified: Secondary | ICD-10-CM

## 2018-09-28 ENCOUNTER — Encounter: Payer: Managed Care, Other (non HMO) | Admitting: Internal Medicine

## 2018-09-28 DIAGNOSIS — Z0289 Encounter for other administrative examinations: Secondary | ICD-10-CM

## 2019-09-05 ENCOUNTER — Other Ambulatory Visit: Payer: Self-pay

## 2019-09-05 ENCOUNTER — Encounter: Payer: Self-pay | Admitting: Internal Medicine

## 2019-09-05 ENCOUNTER — Other Ambulatory Visit: Payer: Self-pay | Admitting: Internal Medicine

## 2019-09-05 ENCOUNTER — Ambulatory Visit (INDEPENDENT_AMBULATORY_CARE_PROVIDER_SITE_OTHER): Payer: Managed Care, Other (non HMO) | Admitting: Internal Medicine

## 2019-09-05 VITALS — BP 120/80 | HR 79 | Temp 97.4°F | Ht 69.5 in | Wt 224.3 lb

## 2019-09-05 DIAGNOSIS — E669 Obesity, unspecified: Secondary | ICD-10-CM | POA: Diagnosis not present

## 2019-09-05 DIAGNOSIS — E559 Vitamin D deficiency, unspecified: Secondary | ICD-10-CM

## 2019-09-05 DIAGNOSIS — Z1211 Encounter for screening for malignant neoplasm of colon: Secondary | ICD-10-CM

## 2019-09-05 LAB — VITAMIN D 25 HYDROXY (VIT D DEFICIENCY, FRACTURES): VITD: 17.1 ng/mL — ABNORMAL LOW (ref 30.00–100.00)

## 2019-09-05 MED ORDER — VITAMIN D (ERGOCALCIFEROL) 1.25 MG (50000 UNIT) PO CAPS: 50000.0000 [IU] | ORAL_CAPSULE | ORAL | 0 refills | Status: AC

## 2019-09-05 NOTE — Patient Instructions (Signed)
-  Nice seeing you today!!  -Lab work today; will notify you once results are available.  -Flu and first shingles vaccine today.  -Schedule follow up in 3-6 months for your physical and your second shingles vaccine.  -GI referral today.

## 2019-09-05 NOTE — Progress Notes (Signed)
Established Patient Office Visit     This visit occurred during the SARS-CoV-2 public health emergency.  Safety protocols were in place, including screening questions prior to the visit, additional usage of staff PPE, and extensive cleaning of exam room while observing appropriate contact time as indicated for disinfecting solutions.    CC/Reason for Visit: Follow-up chronic conditions  HPI: [name] is a 58 y.o. male who is coming in today for the above mentioned reasons.  He has no past medical history of significance other than vitamin D deficiency.  He has routine eye and dental care.  He is due for repeat colonoscopy.  He is due for flu and shingles vaccination series.  Would like to have his vitamin D level rechecked today.  Past Medical/Surgical History: No past medical history on file.  No past surgical history on file.  Social History:  reports that he has never smoked. He has never used smokeless tobacco. He reports current alcohol use. He reports that he does not use drugs.  Allergies: No Known Allergies  Family History:  Family History  Problem Relation Age of Onset  . Alcohol abuse Father   . Colon cancer Neg Hx   . Esophageal cancer Neg Hx   . Stomach cancer Neg Hx   . Rectal cancer Neg Hx      Current Outpatient Medications:  .  ibuprofen (ADVIL) 200 MG tablet, Take 400 mg by mouth as needed., Disp: , Rfl:  .  triamcinolone cream (KENALOG) 0.1 %, Apply 1 application topically 2 (two) times daily., Disp: 80 g, Rfl: 3  Review of Systems:  Constitutional: Denies fever, chills, diaphoresis, appetite change and fatigue.  HEENT: Denies photophobia, eye pain, redness, hearing loss, ear pain, congestion, sore throat, rhinorrhea, sneezing, mouth sores, trouble swallowing, neck pain, neck stiffness and tinnitus.   Respiratory: Denies SOB, DOE, cough, chest tightness,  and wheezing.   Cardiovascular: Denies chest pain, palpitations and leg swelling.   Gastrointestinal: Denies nausea, vomiting, abdominal pain, diarrhea, constipation, blood in stool and abdominal distention.  Genitourinary: Denies dysuria, urgency, frequency, hematuria, flank pain and difficulty urinating.  Endocrine: Denies: hot or cold intolerance, sweats, changes in hair or nails, polyuria, polydipsia. Musculoskeletal: Denies myalgias, back pain, joint swelling, arthralgias and gait problem.  Skin: Denies pallor, rash and wound.  Neurological: Denies dizziness, seizures, syncope, weakness, light-headedness, numbness and headaches.  Hematological: Denies adenopathy. Easy bruising, personal or family bleeding history  Psychiatric/Behavioral: Denies suicidal ideation, mood changes, confusion, nervousness, sleep disturbance and agitation    Physical Exam: Vitals:   09/05/19 0809  BP: 120/80  Pulse: 79  Temp: (!) 97.4 F (36.3 C)  TempSrc: Temporal  SpO2: 96%  Weight: 224 lb 4.8 oz (101.7 kg)  Height: 5' 9.5" (1.765 m)    Body mass index is 32.65 kg/m.   Constitutional: NAD, calm, comfortable Eyes: PERRL, lids and conjunctivae normal, wears corrective lenses ENMT: Mucous membranes are moist.  Respiratory: clear to auscultation bilaterally, no wheezing, no crackles. Normal respiratory effort. No accessory muscle use.  Cardiovascular: Regular rate and rhythm, no murmurs / rubs / gallops. No extremity edema. 2+ pedal pulses.  Abdomen: no tenderness, no masses palpated. No hepatosplenomegaly. Bowel sounds positive.  Psychiatric: Normal judgment and insight. Alert and oriented x 3. Normal mood.    Impression and Plan:  Screening for malignant neoplasm of colon  - Plan: Ambulatory referral to Gastroenterology  Vitamin D deficiency  - Plan: VITAMIN D 25 Hydroxy (Vit-D Deficiency, Fractures)  Obesity (BMI 30.0-34.9) -Discussed healthy lifestyle, including increased physical activity and better food choices to promote weight loss.    Patient Instructions   -Nice seeing you today!!  -Lab work today; will notify you once results are available.  -Flu and first shingles vaccine today.  -Schedule follow up in 3-6 months for your physical and your second shingles vaccine.  -GI referral today.       Lelon Frohlich, MD [facility]

## 2019-09-21 ENCOUNTER — Ambulatory Visit (AMBULATORY_SURGERY_CENTER): Payer: Managed Care, Other (non HMO) | Admitting: *Deleted

## 2019-09-21 ENCOUNTER — Other Ambulatory Visit: Payer: Self-pay

## 2019-09-21 VITALS — Temp 97.1°F | Ht 69.5 in | Wt 224.4 lb

## 2019-09-21 DIAGNOSIS — Z86010 Personal history of colonic polyps: Secondary | ICD-10-CM

## 2019-09-21 DIAGNOSIS — Z1159 Encounter for screening for other viral diseases: Secondary | ICD-10-CM

## 2019-09-21 MED ORDER — SUPREP BOWEL PREP KIT 17.5-3.13-1.6 GM/177ML PO SOLN: 1.0000 | Freq: Once | ORAL | 0 refills | Status: AC

## 2019-09-21 NOTE — Progress Notes (Signed)
No egg or soy allergy known to patient  No issues with past sedation with any surgeries  or procedures, no intubation problems  No diet pills per patient No home 02 use per patient  No blood thinners per patient  Pt denies issues with constipation  No A fib or A flutter  EMMI video sent to pt's e mail  Suprep $15   Due to the COVID-19 pandemic we are asking patients to follow these guidelines. Please only bring one care partner. Please be aware that your care partner may wait in the car in the parking lot or if they feel like they will be too hot to wait in the car, they may wait in the lobby on the 4th floor. All care partners are required to wear a mask the entire time (we do not have any that we can provide them), they need to practice social distancing, and we will do a Covid check for all patient's and care partners when you arrive. Also we will check their temperature and your temperature. If the care partner waits in their car they need to stay in the parking lot the entire time and we will call them on their cell phone when the patient is ready for discharge so they can bring the car to the front of the building. Also all patient's will need to wear a mask into building.

## 2019-09-27 ENCOUNTER — Encounter: Payer: Self-pay | Admitting: Internal Medicine

## 2019-10-03 ENCOUNTER — Ambulatory Visit (INDEPENDENT_AMBULATORY_CARE_PROVIDER_SITE_OTHER): Payer: Managed Care, Other (non HMO)

## 2019-10-03 ENCOUNTER — Other Ambulatory Visit: Payer: Self-pay | Admitting: Internal Medicine

## 2019-10-03 DIAGNOSIS — Z1159 Encounter for screening for other viral diseases: Secondary | ICD-10-CM

## 2019-10-03 LAB — SARS CORONAVIRUS 2 (TAT 6-24 HRS): SARS Coronavirus 2: NEGATIVE

## 2019-10-05 ENCOUNTER — Ambulatory Visit (AMBULATORY_SURGERY_CENTER): Payer: Managed Care, Other (non HMO) | Admitting: Internal Medicine

## 2019-10-05 ENCOUNTER — Other Ambulatory Visit: Payer: Self-pay

## 2019-10-05 ENCOUNTER — Encounter: Payer: Self-pay | Admitting: Internal Medicine

## 2019-10-05 VITALS — BP 130/78 | HR 66 | Temp 98.1°F | Resp 14 | Ht 69.0 in | Wt 224.0 lb

## 2019-10-05 DIAGNOSIS — Z86010 Personal history of colonic polyps: Secondary | ICD-10-CM

## 2019-10-05 DIAGNOSIS — K635 Polyp of colon: Secondary | ICD-10-CM

## 2019-10-05 DIAGNOSIS — D125 Benign neoplasm of sigmoid colon: Secondary | ICD-10-CM

## 2019-10-05 DIAGNOSIS — D124 Benign neoplasm of descending colon: Secondary | ICD-10-CM

## 2019-10-05 MED ORDER — SODIUM CHLORIDE 0.9 % IV SOLN: 500.0000 mL | Freq: Once | INTRAVENOUS | Status: DC

## 2019-10-05 NOTE — Patient Instructions (Signed)
Handouts given for polyps and diverticulosis.  YOU HAD AN ENDOSCOPIC PROCEDURE TODAY AT THE [facility]:   Refer to the procedure report that was given to you for any specific questions about what was found during the examination.  If the procedure report does not answer your questions, please call your gastroenterologist to clarify.  If you requested that your care partner not be given the details of your procedure findings, then the procedure report has been included in a sealed envelope for you to review at your convenience later.  YOU SHOULD EXPECT: Some feelings of bloating in the abdomen. Passage of more gas than usual.  Walking can help get rid of the air that was put into your GI tract during the procedure and reduce the bloating. If you had a lower endoscopy (such as a colonoscopy or flexible sigmoidoscopy) you may notice spotting of blood in your stool or on the toilet paper. If you underwent a bowel prep for your procedure, you may not have a normal bowel movement for a few days.  Please Note:  You might notice some irritation and congestion in your nose or some drainage.  This is from the oxygen used during your procedure.  There is no need for concern and it should clear up in a day or so.  SYMPTOMS TO REPORT IMMEDIATELY:   Following lower endoscopy (colonoscopy or flexible sigmoidoscopy):  Excessive amounts of blood in the stool  Significant tenderness or worsening of abdominal pains  Swelling of the abdomen that is new, acute  Fever of 100F or higher  For urgent or emergent issues, a gastroenterologist can be reached at any hour by calling [phone].   DIET:  We do recommend a small meal at first, but then you may proceed to your regular diet.  Drink plenty of fluids but you should avoid alcoholic beverages for 24 hours.  ACTIVITY:  You should plan to take it easy for the rest of today and you should NOT DRIVE or use heavy machinery until tomorrow (because of  the sedation medicines used during the test).    FOLLOW UP: Our staff will call the number listed on your records 48-72 hours following your procedure to check on you and address any questions or concerns that you may have regarding the information given to you following your procedure. If we do not reach you, we will leave a message.  We will attempt to reach you two times.  During this call, we will ask if you have developed any symptoms of COVID 19. If you develop any symptoms (ie: fever, flu-like symptoms, shortness of breath, cough etc.) before then, please call [phone].  If you test positive for Covid 19 in the 2 weeks post procedure, please call and report this information to us.    If any biopsies were taken you will be contacted by phone or by letter within the next 1-3 weeks.  Please call us at [phone] if you have not heard about the biopsies in 3 weeks.    SIGNATURES/CONFIDENTIALITY: You and/or your care partner have signed paperwork which will be entered into your electronic medical record.  These signatures attest to the fact that that the information above on your After Visit Summary has been reviewed and is understood.  Full responsibility of the confidentiality of this discharge information lies with you and/or your care-partner. 

## 2019-10-05 NOTE — Progress Notes (Signed)
A and O x3. Report to RN. Tolerated MAC anesthesia well.

## 2019-10-05 NOTE — Op Note (Signed)
[name] Patient Name: [name] Procedure Date: 10/05/2019 1:32 PM MRN: [id number] Endoscopist: Jerene Bears , MD Age: 59 Referring MD:  Date of Birth: 03-Nov-1960 Gender: Male Account #: [id number] Procedure:                Colonoscopy Indications:              High risk colon cancer surveillance: Personal                            history of non-advanced adenoma, Last colonoscopy:                            January 2014 Medicines:                Monitored Anesthesia Care Procedure:                Pre-Anesthesia Assessment:                           - Prior to the procedure, a History and Physical                            was performed, and patient medications and                            allergies were reviewed. The patient's tolerance of                            previous anesthesia was also reviewed. The risks                            and benefits of the procedure and the sedation                            options and risks were discussed with the patient.                            All questions were answered, and informed consent                            was obtained. Prior Anticoagulants: The patient has                            taken no previous anticoagulant or antiplatelet                            agents. ASA Grade Assessment: II - A patient with                            mild systemic disease. After reviewing the risks                            and benefits, the patient was deemed in  satisfactory condition to undergo the procedure.                           After obtaining informed consent, the colonoscope                            was passed under direct vision. Throughout the                            procedure, the patient's blood pressure, pulse, and                            oxygen saturations were monitored continuously. The                            Colonoscope was introduced through the anus and                           advanced to the cecum, identified by appendiceal                            orifice and ileocecal valve. The colonoscopy was                            performed without difficulty. The patient tolerated                            the procedure well. The quality of the bowel                            preparation was excellent. The ileocecal valve,                            appendiceal orifice, and rectum were photographed. Scope In: 1:44:15 PM Scope Out: 1:56:53 PM Scope Withdrawal Time: 0 hours 9 minutes 49 seconds  Total Procedure Duration: 0 hours 12 minutes 38 seconds  Findings:                 The digital rectal exam was normal.                           A 3 mm polyp was found in the descending colon. The                            polyp was sessile. The polyp was removed with a                            cold snare. Resection and retrieval were complete.                           A 6 mm polyp was found in the sigmoid colon. The                            polyp was sessile. The  polyp was removed with a                            cold snare. Resection and retrieval were complete.                           A single small-mouthed diverticulum was found in                            the cecum.                           The retroflexed view of the distal rectum and anal                            verge was normal and showed no anal or rectal                            abnormalities. Complications:            No immediate complications. Estimated Blood Loss:     Estimated blood loss was minimal. Impression:               - One 3 mm polyp in the descending colon, removed                            with a cold snare. Resected and retrieved.                           - One 6 mm polyp in the sigmoid colon, removed with                            a cold snare. Resected and retrieved.                           - Diverticulosis in the cecum.                           -  The distal rectum and anal verge are normal on                            retroflexion view. Recommendation:           - Patient has a contact number available for                            emergencies. The signs and symptoms of potential                            delayed complications were discussed with the                            patient. Return to normal activities tomorrow.                            Written  discharge instructions were provided to the                            patient.                           - Resume previous diet.                           - Continue present medications.                           - Await pathology results.                           - Repeat colonoscopy is recommended for                            surveillance. The colonoscopy date will be                            determined after pathology results from today's                            exam become available for review. Jerene Bears, MD 10/05/2019 1:59:31 PM This report has been signed electronically.

## 2019-10-05 NOTE — Progress Notes (Signed)
Called to room to assist during endoscopic procedure.  Patient ID and intended procedure confirmed with present staff. Received instructions for my participation in the procedure from the performing physician.  

## 2019-10-09 ENCOUNTER — Telehealth: Payer: Self-pay

## 2019-10-09 NOTE — Telephone Encounter (Signed)
Attempted to reach pt. With follow-up call following endoscopic procedure 10/05/2019.  Pt.'s voice mail box full, unable to LM.  Will try to reach pt. Again later today.

## 2019-10-09 NOTE — Telephone Encounter (Signed)
2nd follow up call made.  NAULM 

## 2019-10-10 ENCOUNTER — Encounter: Payer: Self-pay | Admitting: Internal Medicine

## 2019-11-23 ENCOUNTER — Other Ambulatory Visit: Payer: Self-pay | Admitting: Family

## 2019-11-23 ENCOUNTER — Other Ambulatory Visit: Payer: Self-pay | Admitting: Internal Medicine

## 2019-11-23 DIAGNOSIS — E559 Vitamin D deficiency, unspecified: Secondary | ICD-10-CM

## 2020-08-01 ENCOUNTER — Telehealth: Payer: Self-pay

## 2020-08-01 NOTE — Telephone Encounter (Signed)
[place] with me, please arrange a OV at his convenience

## 2020-08-01 NOTE — Telephone Encounter (Signed)
Pt called asking if he could transfer care and get established with Dr. Larose Kells. Pt states they both have a mutual friend who recommended that he should try and become a patient of Dr. Larose Kells. Pt states they both play tennis at the same tennis club. Ok to schedule TOC?

## 2020-08-01 NOTE — Telephone Encounter (Signed)
Ok with me 

## 2020-08-02 NOTE — Telephone Encounter (Signed)
Okay to schedule NP appt.  

## 2020-08-02 NOTE — Telephone Encounter (Signed)
LVM for pt to call office and schedule TOC/new pt.

## 2020-08-06 NOTE — Telephone Encounter (Signed)
Appt scheduled with pt.

## 2020-08-22 DIAGNOSIS — D369 Benign neoplasm, unspecified site: Secondary | ICD-10-CM | POA: Insufficient documentation

## 2020-08-22 DIAGNOSIS — Z87442 Personal history of urinary calculi: Secondary | ICD-10-CM | POA: Insufficient documentation

## 2020-08-22 DIAGNOSIS — N189 Chronic kidney disease, unspecified: Secondary | ICD-10-CM | POA: Insufficient documentation

## 2020-09-05 ENCOUNTER — Ambulatory Visit: Payer: Managed Care, Other (non HMO) | Admitting: Internal Medicine

## 2020-09-05 ENCOUNTER — Encounter: Payer: Self-pay | Admitting: Internal Medicine

## 2020-09-05 ENCOUNTER — Other Ambulatory Visit: Payer: Self-pay

## 2020-09-05 VITALS — BP 135/91 | HR 69 | Temp 97.9°F | Ht 69.0 in | Wt 190.8 lb

## 2020-09-05 DIAGNOSIS — E559 Vitamin D deficiency, unspecified: Secondary | ICD-10-CM

## 2020-09-05 DIAGNOSIS — Z0000 Encounter for general adult medical examination without abnormal findings: Secondary | ICD-10-CM | POA: Diagnosis not present

## 2020-09-05 LAB — CBC WITH DIFFERENTIAL/PLATELET
Basophils Absolute: 0.1 10*3/uL (ref 0.0–0.1)
Basophils Relative: 1.2 % (ref 0.0–3.0)
Eosinophils Absolute: 0.1 10*3/uL (ref 0.0–0.7)
Eosinophils Relative: 2.7 % (ref 0.0–5.0)
HCT: 48.1 % (ref 39.0–52.0)
Hemoglobin: 16.2 g/dL (ref 13.0–17.0)
Lymphocytes Relative: 38.6 % (ref 12.0–46.0)
Lymphs Abs: 2.1 10*3/uL (ref 0.7–4.0)
MCHC: 33.7 g/dL (ref 30.0–36.0)
MCV: 87.9 fl (ref 78.0–100.0)
Monocytes Absolute: 0.4 10*3/uL (ref 0.1–1.0)
Monocytes Relative: 8.3 % (ref 3.0–12.0)
Neutro Abs: 2.6 10*3/uL (ref 1.4–7.7)
Neutrophils Relative %: 49.2 % (ref 43.0–77.0)
Platelets: 246 10*3/uL (ref 150.0–400.0)
RBC: 5.47 Mil/uL (ref 4.22–5.81)
RDW: 12.7 % (ref 11.5–15.5)
WBC: 5.3 10*3/uL (ref 4.0–10.5)

## 2020-09-05 LAB — COMPREHENSIVE METABOLIC PANEL
ALT: 28 U/L (ref 0–53)
AST: 34 U/L (ref 0–37)
Albumin: 4.7 g/dL (ref 3.5–5.2)
Alkaline Phosphatase: 45 U/L (ref 39–117)
BUN: 20 mg/dL (ref 6–23)
CO2: 31 mEq/L (ref 19–32)
Calcium: 9.6 mg/dL (ref 8.4–10.5)
Chloride: 102 mEq/L (ref 96–112)
Creatinine, Ser: 1.1 mg/dL (ref 0.40–1.50)
GFR: 73.82 mL/min (ref >60.00)
Glucose, Bld: 101 mg/dL — ABNORMAL HIGH (ref 70–99)
Potassium: 5 mEq/L (ref 3.5–5.1)
Sodium: 138 mEq/L (ref 135–145)
Total Bilirubin: 0.9 mg/dL (ref 0.2–1.2)
Total Protein: 7.2 g/dL (ref 6.0–8.3)

## 2020-09-05 LAB — LIPID PANEL
Cholesterol: 202 mg/dL — ABNORMAL HIGH (ref 0–200)
HDL: 56.2 mg/dL (ref >39.00)
LDL Cholesterol: 121 mg/dL — ABNORMAL HIGH (ref 0–99)
NonHDL: 145.88
Total CHOL/HDL Ratio: 4
Triglycerides: 125 mg/dL (ref 0.0–149.0)
VLDL: 25 mg/dL (ref 0.0–40.0)

## 2020-09-05 LAB — PSA: PSA: 1.16 ng/mL (ref 0.10–4.00)

## 2020-09-05 LAB — VITAMIN D 25 HYDROXY (VIT D DEFICIENCY, FRACTURES): VITD: 69.71 ng/mL (ref 30.00–100.00)

## 2020-09-05 MED ORDER — SILDENAFIL CITRATE 20 MG PO TABS: 60.0000 mg | ORAL_TABLET | Freq: Every evening | ORAL | 3 refills | Status: DC | PRN

## 2020-09-05 NOTE — Progress Notes (Signed)
Subjective:    Patient ID: [name], male    DOB: [birth year], 59 y.o.   MRN: [id number]  DOS:  09/05/2020 Type of visit - description: New patient, transferring from another office.  Here for CPX  Doing great. He reports some difficulty with erections (quality and duration) for the last year or 2, libido is okay.  Treatment options?  Wt Readings from Last 3 Encounters:  09/05/20 190 lb 12.8 oz (86.5 kg)  10/05/19 224 lb (101.6 kg)  09/21/19 224 lb 6.4 oz (101.8 kg)   BP Readings from Last 3 Encounters:  09/05/20 (!) 135/91  10/05/19 130/78  09/05/19 120/80     Review of Systems  Other than above, a 14 point review of systems is negative     Past Medical History:  Diagnosis Date  . Adenomatous polyps   . Allergy    Spring - prn allegra if needed   . History of kidney stones   . Vitamin D deficiency     Past Surgical History:  Procedure Laterality Date  . COLONOSCOPY    . POLYPECTOMY      Social History   Socioeconomic History  . Marital status: Married    Spouse name: Not on file  . Number of children: 2  . Years of education: Not on file  . Highest education level: Not on file  Occupational History  . Occupation: Medical illustrator   Tobacco Use  . Smoking status: Never Smoker  . Smokeless tobacco: Never Used  Substance and Sexual Activity  . Alcohol use: Yes    Comment: rarely  . Drug use: No  . Sexual activity: Not on file  Other Topics Concern  . Not on file  Social History Narrative   Boy: 2001   Boy: 2004   Social Determinants of Health   Financial Resource Strain: Not on file  Food Insecurity: Not on file  Transportation Needs: Not on file  Physical Activity: Not on file  Stress: Not on file  Social Connections: Not on file  Intimate Partner Violence: Not on file     Allergies as of 09/05/2020   No Known Allergies     Medication List       Accurate as of September 05, 2020 11:59 PM. If you have any questions, ask your  nurse or doctor.        ibuprofen 200 MG tablet Commonly known as: ADVIL Take 400 mg by mouth as needed.   MULTIVITAMIN GUMMIES ADULT PO Take by mouth.   sildenafil 20 MG tablet Commonly known as: REVATIO Take 3-4 tablets (60-80 mg total) by mouth at bedtime as needed. Started by: Kathlene November, MD   triamcinolone 0.1 % Commonly known as: KENALOG Apply 1 application topically 2 (two) times daily.          Objective:   Physical Exam BP (!) 135/91 (BP Location: Right Arm, Patient Position: Sitting, Cuff Size: Large)   Pulse 69   Temp 97.9 F (36.6 C) (Oral)   Ht 5\' 9"  (1.753 m)   Wt 190 lb 12.8 oz (86.5 kg)   SpO2 97%   BMI 28.18 kg/m  General: Well developed, NAD, BMI noted Neck: No  thyromegaly  HEENT:  Normocephalic . Face symmetric, atraumatic Lungs:  CTA B Normal respiratory effort, no intercostal retractions, no accessory muscle use. Heart: RRR,  no murmur.  Abdomen:  Not distended, soft, non-tender. No rebound or rigidity. DRE: Normal sphincter tone, no stools, prostate normal Lower extremities:  no pretibial edema bilaterally  Skin: Exposed areas without rash. Not pale. Not jaundice Neurologic:  alert & oriented X3.  Speech normal, gait appropriate for age and unassisted Strength symmetric and appropriate for age.  Psych: Cognition and judgment appear intact.  Cooperative with normal attention span and concentration.  Behavior appropriate. No anxious or depressed appearing.     Assessment      Assessment: History of kidney stones Colon polyps Vitamin D deficiency  Plan: New patient. CPX today ED: As described above, no obvious physical issues, emotionally doing well, recommend a trial with sildenafil, how to use it, side effect and what to expect discussed. Vitamin D deficiency: On OTCs, checking levels Diastolic BP: Minimally elevated, recommend to check BPs once a month. RTC 1 year    This visit occurred during the SARS-CoV-2 public  health emergency.  Safety protocols were in place, including screening questions prior to the visit, additional usage of staff PPE, and extensive cleaning of exam room while observing appropriate contact time as indicated for disinfecting solutions.

## 2020-09-05 NOTE — Patient Instructions (Signed)
Check the  blood pressure once a month. BP GOAL is between 110/65 and  135/85. If it is consistently higher or lower, let me know  Try sildenafil.  GO TO THE LAB : Get the blood work     [place], [place] Come back for a physical exam in 1 year

## 2020-09-08 ENCOUNTER — Encounter: Payer: Self-pay | Admitting: Internal Medicine

## 2020-09-08 DIAGNOSIS — Z0000 Encounter for general adult medical examination without abnormal findings: Secondary | ICD-10-CM | POA: Insufficient documentation

## 2020-09-08 NOTE — Assessment & Plan Note (Signed)
Td 2015 covid vax x3 Flu shot: had a mild reaction years ago, has not tried again since then, encouraged to proceed at his convenience Prostate ca screening: DRE normal, no symptoms, check a PSA CCS: Colonoscopy 2014, colonoscopy 10/05/2019, next per GI FH CAD father, he was a smoker: Pt asx, has an excellent lifestyle without any symptoms.  CV RF 10 years 7.9%. Labs: CMP, FLP, CBC, PSA, vitamin D Lifestyle: Few months ago he decided to completely change his diet habits and increase his exercise, has lost 34 pounds, praised! rec to continue in that path.

## 2021-03-05 ENCOUNTER — Other Ambulatory Visit: Payer: Self-pay | Admitting: Internal Medicine

## 2021-09-09 ENCOUNTER — Encounter: Payer: Self-pay | Admitting: Internal Medicine

## 2021-09-09 ENCOUNTER — Ambulatory Visit (INDEPENDENT_AMBULATORY_CARE_PROVIDER_SITE_OTHER): Payer: Managed Care, Other (non HMO) | Admitting: Internal Medicine

## 2021-09-09 VITALS — BP 122/80 | HR 67 | Temp 98.0°F | Resp 16 | Ht 69.0 in | Wt 208.1 lb

## 2021-09-09 DIAGNOSIS — E559 Vitamin D deficiency, unspecified: Secondary | ICD-10-CM

## 2021-09-09 DIAGNOSIS — Z0000 Encounter for general adult medical examination without abnormal findings: Secondary | ICD-10-CM | POA: Diagnosis not present

## 2021-09-09 DIAGNOSIS — Z23 Encounter for immunization: Secondary | ICD-10-CM

## 2021-09-09 DIAGNOSIS — R635 Abnormal weight gain: Secondary | ICD-10-CM

## 2021-09-09 LAB — LIPID PANEL
Cholesterol: 190 mg/dL (ref 0–200)
HDL: 56.5 mg/dL (ref >39.00)
LDL Cholesterol: 108 mg/dL — ABNORMAL HIGH (ref 0–99)
NonHDL: 133.47
Total CHOL/HDL Ratio: 3
Triglycerides: 126 mg/dL (ref 0.0–149.0)
VLDL: 25.2 mg/dL (ref 0.0–40.0)

## 2021-09-09 LAB — COMPREHENSIVE METABOLIC PANEL
ALT: 24 U/L (ref 0–53)
AST: 18 U/L (ref 0–37)
Albumin: 4.3 g/dL (ref 3.5–5.2)
Alkaline Phosphatase: 41 U/L (ref 39–117)
BUN: 14 mg/dL (ref 6–23)
CO2: 28 mEq/L (ref 19–32)
Calcium: 9.4 mg/dL (ref 8.4–10.5)
Chloride: 104 mEq/L (ref 96–112)
Creatinine, Ser: 0.97 mg/dL (ref 0.40–1.50)
GFR: 85.24 mL/min (ref >60.00)
Glucose, Bld: 104 mg/dL — ABNORMAL HIGH (ref 70–99)
Potassium: 4.5 mEq/L (ref 3.5–5.1)
Sodium: 139 mEq/L (ref 135–145)
Total Bilirubin: 0.7 mg/dL (ref 0.2–1.2)
Total Protein: 6.6 g/dL (ref 6.0–8.3)

## 2021-09-09 LAB — CBC WITH DIFFERENTIAL/PLATELET
Basophils Absolute: 0.1 10*3/uL (ref 0.0–0.1)
Basophils Relative: 1.3 % (ref 0.0–3.0)
Eosinophils Absolute: 0.2 10*3/uL (ref 0.0–0.7)
Eosinophils Relative: 3.9 % (ref 0.0–5.0)
HCT: 44.6 % (ref 39.0–52.0)
Hemoglobin: 15.2 g/dL (ref 13.0–17.0)
Lymphocytes Relative: 38 % (ref 12.0–46.0)
Lymphs Abs: 2 10*3/uL (ref 0.7–4.0)
MCHC: 34.1 g/dL (ref 30.0–36.0)
MCV: 88.2 fl (ref 78.0–100.0)
Monocytes Absolute: 0.4 10*3/uL (ref 0.1–1.0)
Monocytes Relative: 8.1 % (ref 3.0–12.0)
Neutro Abs: 2.6 10*3/uL (ref 1.4–7.7)
Neutrophils Relative %: 48.7 % (ref 43.0–77.0)
Platelets: 252 10*3/uL (ref 150.0–400.0)
RBC: 5.05 Mil/uL (ref 4.22–5.81)
RDW: 12.8 % (ref 11.5–15.5)
WBC: 5.4 10*3/uL (ref 4.0–10.5)

## 2021-09-09 LAB — VITAMIN D 25 HYDROXY (VIT D DEFICIENCY, FRACTURES): VITD: 35.52 ng/mL (ref 30.00–100.00)

## 2021-09-09 LAB — PSA: PSA: 0.98 ng/mL (ref 0.10–4.00)

## 2021-09-09 LAB — HEMOGLOBIN A1C: Hgb A1c MFr Bld: 5.4 % (ref 4.6–6.5)

## 2021-09-09 NOTE — Patient Instructions (Signed)
° ° °  GO TO THE LAB : Get the blood work     GO TO THE FRONT DESK, [place] back for your second Shingrix dose anytime between 2 and 4 months. We can do an EKG then  Next physical in 1 year

## 2021-09-09 NOTE — Progress Notes (Signed)
Subjective:    Patient ID: [name], male    DOB: 03/25/1961, 60 y.o.   MRN: [id number]  DOS:  09/09/2021 Type of visit - description: cpx Since the last office visit he is feeling well. Weight gain noted.  Wt Readings from Last 3 Encounters:  09/09/21 208 lb 2 oz (94.4 kg)  09/05/20 190 lb 12.8 oz (86.5 kg)  10/05/19 224 lb (101.6 kg)     Review of Systems  A 14 point review of systems is negative    Past Medical History:  Diagnosis Date   Adenomatous polyps    Allergy    Spring - prn allegra if needed    History of kidney stones    Vitamin D deficiency     Past Surgical History:  Procedure Laterality Date   COLONOSCOPY     POLYPECTOMY     Social History   Socioeconomic History   Marital status: Married    Spouse name: Not on file   Number of children: 2   Years of education: Not on file   Highest education level: Not on file  Occupational History   Occupation: Medical illustrator   Tobacco Use   Smoking status: Never   Smokeless tobacco: Never  Substance and Sexual Activity   Alcohol use: Yes    Comment: rarely   Drug use: No   Sexual activity: Not on file  Other Topics Concern   Not on file  Social History Narrative   Boy: 2001   Boy: 2004   Social Determinants of Health   Financial Resource Strain: Not on file  Food Insecurity: Not on file  Transportation Needs: Not on file  Physical Activity: Not on file  Stress: Not on file  Social Connections: Not on file  Intimate Partner Violence: Not on file    Allergies as of 09/09/2021   No Known Allergies      Medication List        Accurate as of September 09, 2021 11:59 PM. If you have any questions, ask your nurse or doctor.          STOP taking these medications    triamcinolone cream 0.1 % Commonly known as: KENALOG Stopped by: Kathlene November, MD       TAKE these medications    ibuprofen 200 MG tablet Commonly known as: ADVIL Take 400 mg by mouth as needed.    MULTIVITAMIN GUMMIES ADULT PO Take by mouth.   sildenafil 20 MG tablet Commonly known as: REVATIO Take 3-4 tablets by mouth at bedtime as needed           Objective:   Physical Exam BP 122/80 (BP Location: Left Arm, Patient Position: Sitting, Cuff Size: Normal)    Pulse 67    Temp 98 F (36.7 C) (Oral)    Resp 16    Ht 5\' 9"  (1.753 m)    Wt 208 lb 2 oz (94.4 kg)    SpO2 96%    BMI 30.73 kg/m  General: Well developed, NAD, BMI noted Neck: No  thyromegaly  HEENT:  Normocephalic . Face symmetric, atraumatic Lungs:  CTA B Normal respiratory effort, no intercostal retractions, no accessory muscle use. Heart: RRR,  no murmur.  Abdomen:  Not distended, soft, non-tender. No rebound or rigidity.   Lower extremities: no pretibial edema bilaterally  Skin: Exposed areas without rash. Not pale. Not jaundice Neurologic:  alert & oriented X3.  Speech normal, gait appropriate for age and unassisted Strength symmetric  and appropriate for age.  Psych: Cognition and judgment appear intact.  Cooperative with normal attention span and concentration.  Behavior appropriate. No anxious or depressed appearing.     Assessment    ASSESSMENT History of kidney stones Colon polyps Vitamin D deficiency  PLAN Here for CPX, doing well H/o  vitamin D def:On multivitamins.  Checking labs. RTC Shingrix No. 2 anytime between 2 and 4 months. RTC CPX 1 year   This visit occurred during the SARS-CoV-2 public health emergency.  Safety protocols were in place, including screening questions prior to the visit, additional usage of staff PPE, and extensive cleaning of exam room while observing appropriate contact time as indicated for disinfecting solutions.

## 2021-09-10 ENCOUNTER — Encounter: Payer: Self-pay | Admitting: Internal Medicine

## 2021-09-10 NOTE — Assessment & Plan Note (Signed)
-  Td 2015 -Shingrix No. 1 today, come back for the next in 2 to 4 months -covid vax x3, booster rec  -Flu shot: had a mild reaction years ago, he agreed to proceed with a flu shot today - Prostate ca screening: DRE normal last year, recheck PSA. - CCS: Colonoscopy 2014, colonoscopy 10/05/2019, next per GI - FH CAD father, he was a smoker: Patient has no symptoms, EKG today: Unable to do due to chest hair, plans to shave   for the next visit   - Labs CMP, FLP, CBC, A1c, PSA, vitamin D - Lifestyle: Was doing great but let his guard down this year, plans to go back to healthier lifestyle - POA package of information provided

## 2021-11-18 ENCOUNTER — Ambulatory Visit: Payer: Managed Care, Other (non HMO)

## 2021-11-20 ENCOUNTER — Ambulatory Visit (INDEPENDENT_AMBULATORY_CARE_PROVIDER_SITE_OTHER): Payer: Managed Care, Other (non HMO)

## 2021-11-20 DIAGNOSIS — Z0000 Encounter for general adult medical examination without abnormal findings: Secondary | ICD-10-CM | POA: Diagnosis not present

## 2021-11-20 DIAGNOSIS — Z23 Encounter for immunization: Secondary | ICD-10-CM | POA: Diagnosis not present

## 2021-11-20 NOTE — Progress Notes (Signed)
Patient was here today for shingles #2 and EKG.  Injection given w/o any difficulties and EKG done and given to provider for review.  ?Rod Holler CMA ?

## 2021-11-20 NOTE — Progress Notes (Signed)
Patient was here today for shingles #2 and EKG.   Injection administered w/o any complications and EKG done today, given to provider. Rod Holler CMA ?

## 2022-01-30 ENCOUNTER — Other Ambulatory Visit: Payer: Self-pay | Admitting: Internal Medicine

## 2022-09-02 ENCOUNTER — Other Ambulatory Visit: Payer: Self-pay | Admitting: Internal Medicine

## 2022-09-10 ENCOUNTER — Encounter: Payer: Self-pay | Admitting: Internal Medicine

## 2022-09-10 ENCOUNTER — Ambulatory Visit (INDEPENDENT_AMBULATORY_CARE_PROVIDER_SITE_OTHER): Payer: Commercial Managed Care - PPO | Admitting: Internal Medicine

## 2022-09-10 VITALS — BP 128/72 | HR 62 | Temp 97.7°F | Resp 16 | Ht 69.0 in | Wt 214.0 lb

## 2022-09-10 DIAGNOSIS — Z125 Encounter for screening for malignant neoplasm of prostate: Secondary | ICD-10-CM | POA: Diagnosis not present

## 2022-09-10 DIAGNOSIS — E559 Vitamin D deficiency, unspecified: Secondary | ICD-10-CM | POA: Diagnosis not present

## 2022-09-10 DIAGNOSIS — Z23 Encounter for immunization: Secondary | ICD-10-CM

## 2022-09-10 DIAGNOSIS — Z0000 Encounter for general adult medical examination without abnormal findings: Secondary | ICD-10-CM

## 2022-09-10 DIAGNOSIS — Z09 Encounter for follow-up examination after completed treatment for conditions other than malignant neoplasm: Secondary | ICD-10-CM | POA: Insufficient documentation

## 2022-09-10 LAB — CBC WITH DIFFERENTIAL/PLATELET
Basophils Absolute: 0.1 10*3/uL (ref 0.0–0.1)
Basophils Relative: 1.2 % (ref 0.0–3.0)
Eosinophils Absolute: 0.1 10*3/uL (ref 0.0–0.7)
Eosinophils Relative: 2.8 % (ref 0.0–5.0)
HCT: 45.4 % (ref 39.0–52.0)
Hemoglobin: 15.5 g/dL (ref 13.0–17.0)
Lymphocytes Relative: 37 % (ref 12.0–46.0)
Lymphs Abs: 1.8 10*3/uL (ref 0.7–4.0)
MCHC: 34.2 g/dL (ref 30.0–36.0)
MCV: 86.8 fl (ref 78.0–100.0)
Monocytes Absolute: 0.5 10*3/uL (ref 0.1–1.0)
Monocytes Relative: 9.4 % (ref 3.0–12.0)
Neutro Abs: 2.4 10*3/uL (ref 1.4–7.7)
Neutrophils Relative %: 49.6 % (ref 43.0–77.0)
Platelets: 260 10*3/uL (ref 150.0–400.0)
RBC: 5.23 Mil/uL (ref 4.22–5.81)
RDW: 13.1 % (ref 11.5–15.5)
WBC: 4.9 10*3/uL (ref 4.0–10.5)

## 2022-09-10 LAB — LIPID PANEL
Cholesterol: 218 mg/dL — ABNORMAL HIGH (ref 0–200)
HDL: 63.1 mg/dL (ref >39.00)
LDL Cholesterol: 130 mg/dL — ABNORMAL HIGH (ref 0–99)
NonHDL: 154.46
Total CHOL/HDL Ratio: 3
Triglycerides: 123 mg/dL (ref 0.0–149.0)
VLDL: 24.6 mg/dL (ref 0.0–40.0)

## 2022-09-10 LAB — COMPREHENSIVE METABOLIC PANEL
ALT: 27 U/L (ref 0–53)
AST: 20 U/L (ref 0–37)
Albumin: 4.4 g/dL (ref 3.5–5.2)
Alkaline Phosphatase: 49 U/L (ref 39–117)
BUN: 17 mg/dL (ref 6–23)
CO2: 28 mEq/L (ref 19–32)
Calcium: 9.1 mg/dL (ref 8.4–10.5)
Chloride: 105 mEq/L (ref 96–112)
Creatinine, Ser: 0.95 mg/dL (ref 0.40–1.50)
GFR: 86.78 mL/min (ref >60.00)
Glucose, Bld: 101 mg/dL — ABNORMAL HIGH (ref 70–99)
Potassium: 4.1 mEq/L (ref 3.5–5.1)
Sodium: 140 mEq/L (ref 135–145)
Total Bilirubin: 0.6 mg/dL (ref 0.2–1.2)
Total Protein: 6.6 g/dL (ref 6.0–8.3)

## 2022-09-10 MED ORDER — VITAMIN D3 50 MCG (2000 UT) PO CAPS: 2000.0000 [IU] | ORAL_CAPSULE | Freq: Every day | ORAL | Status: AC

## 2022-09-10 NOTE — Assessment & Plan Note (Signed)
-  Td 2015 -S/p shingrix x2 -RSV: d/w pt  -covid vax x3, booster rec  -Flu shot:  today - Prostate ca screening: No sxs or FH, last PSA < 1.0, deferred DRE today, check PSA. - CCS: Colonoscopy 2014, colonoscopy 10/05/2019, + polyps x 2, next in 10 years per GI letter - FH CAD father, he was a smoker: [place] has no sxs  - Labs CMP FLP CBC TSH PSA vitamin D - Lifestyle: Active, plays tennis, encouraged healthy diet - Healthcare POA: See AVS

## 2022-09-10 NOTE — Progress Notes (Signed)
   Subjective:    Patient ID: [name], male    DOB: June 11, 1961, 61 y.o.   MRN: [id number]  DOS:  09/10/2022 Type of visit - description: CPX  Here for CPX Has no major concerns. Currently taking ibuprofen on amoxicillin prescribed by ENT.  Review of Systems  Other than above, a 14 point review of systems is negative     Past Medical History:  Diagnosis Date   Adenomatous polyps    Allergy    Spring - prn allegra if needed    History of kidney stones    Vitamin D deficiency     Past Surgical History:  Procedure Laterality Date   COLONOSCOPY     POLYPECTOMY     Social History   Socioeconomic History   Marital status: Married    Spouse name: Not on file   Number of children: 2   Years of education: Not on file   Highest education level: Not on file  Occupational History   Occupation: Medical illustrator   Tobacco Use   Smoking status: Never   Smokeless tobacco: Never  Substance and Sexual Activity   Alcohol use: Yes    Comment: rarely   Drug use: No   Sexual activity: Not on file  Other Topics Concern   Not on file  Social History Narrative   Household: pt and wife    Boy: 2001   Boy: 2004      Play tennis    Social Determinants of Health   Financial Resource Strain: Not on file  Food Insecurity: Not on file  Transportation Needs: Not on file  Physical Activity: Not on file  Stress: Not on file  Social Connections: Not on file  Intimate Partner Violence: Not on file    Current Outpatient Medications  Medication Instructions   amoxicillin (AMOXIL) 500 mg, Oral, 3 times daily   ibuprofen (ADVIL) 400 mg, Oral, As needed   Multiple Vitamins-Minerals (MULTIVITAMIN GUMMIES ADULT PO) Oral   sildenafil (REVATIO) 20 MG tablet Take 3-4 tablets by mouth at bedtime as needed   Vitamin D3 2,000 Units, Oral, Daily       Objective:   Physical Exam BP 128/72   Pulse 62   Temp 97.7 F (36.5 C) (Oral)   Resp 16   Ht '5\' 9"'$  (1.753 m)   Wt 214 lb  (97.1 kg)   SpO2 98%   BMI 31.60 kg/m  General: Well developed, NAD, BMI noted Neck: No  thyromegaly  HEENT:  Normocephalic . Face symmetric, atraumatic Lungs:  CTA B Normal respiratory effort, no intercostal retractions, no accessory muscle use. Heart: RRR,  no murmur.  Abdomen:  Not distended, soft, non-tender. No rebound or rigidity.   Lower extremities: no pretibial edema bilaterally DRE: Deferred Skin: Exposed areas without rash. Not pale. Not jaundice Neurologic:  alert & oriented X3.  Speech normal, gait appropriate for age and unassisted Strength symmetric and appropriate for age.  Psych: Cognition and judgment appear intact.  Cooperative with normal attention span and concentration.  Behavior appropriate. No anxious or depressed appearing.     Assessment    ASSESSMENT History of kidney stones Colon polyps Vitamin D deficiency  PLAN Here for CPX Vitamin D deficiency: Recommend vitamin D 2000 units daily checking labs. RTC 1 year

## 2022-09-10 NOTE — Assessment & Plan Note (Signed)
Here for CPX Vitamin D deficiency: Recommend vitamin D 2000 units daily checking labs. RTC 1 year

## 2022-09-10 NOTE — Patient Instructions (Addendum)
Vaccines I recommend:  Covid booster RSV vaccine   Take vitamin D 2000 units daily (over-the-counter)    GO TO THE LAB : Get the blood work     [place], [place] back for a physical exam in 1 year    Do you have a "Living will" or "[place] of attorney"? (Advance care planning documents)  If you already have a living will or healthcare power of attorney, is recommended you bring the copy to be scanned in your chart. The document will be available to all the doctors you see in the system.  If you don't have one, please consider create one.

## 2022-09-11 LAB — VITAMIN D 25 HYDROXY (VIT D DEFICIENCY, FRACTURES): VITD: 30.64 ng/mL (ref 30.00–100.00)

## 2022-09-11 LAB — PSA: PSA: 1.19 ng/mL (ref 0.10–4.00)

## 2022-09-11 LAB — TSH: TSH: 2.04 u[IU]/mL (ref 0.35–5.50)

## 2022-09-17 ENCOUNTER — Encounter: Payer: Self-pay | Admitting: Internal Medicine

## 2023-06-02 ENCOUNTER — Ambulatory Visit: Payer: Commercial Managed Care - PPO | Admitting: Internal Medicine

## 2023-06-04 ENCOUNTER — Ambulatory Visit (INDEPENDENT_AMBULATORY_CARE_PROVIDER_SITE_OTHER): Payer: Commercial Managed Care - PPO | Admitting: Internal Medicine

## 2023-06-04 VITALS — BP 136/80 | HR 70 | Temp 98.4°F | Resp 16 | Ht 69.0 in | Wt 197.1 lb

## 2023-06-04 DIAGNOSIS — R229 Localized swelling, mass and lump, unspecified: Secondary | ICD-10-CM | POA: Diagnosis not present

## 2023-06-04 DIAGNOSIS — L812 Freckles: Secondary | ICD-10-CM | POA: Diagnosis not present

## 2023-06-04 NOTE — Progress Notes (Unsigned)
Subjective:    Patient ID: [name], male    DOB: [birth year], 62 y.o.   MRN: [id number]  DOS:  06/04/2023 Type of visit - description: Acute  Recently, she noted lump under the skin at the right lateral chest.  Slightly tender to touch.  No discharge.  Also his wife noted some discoloration (few months ago) at  the right ear.  No bleeding.   Review of Systems See above   Past Medical History:  Diagnosis Date   Adenomatous polyps    Allergy    Spring - prn allegra if needed    History of kidney stones    Vitamin D deficiency     Past Surgical History:  Procedure Laterality Date   COLONOSCOPY     POLYPECTOMY      Current Outpatient Medications  Medication Instructions   ibuprofen (ADVIL) 400 mg, Oral, As needed   Multiple Vitamins-Minerals (MULTIVITAMIN GUMMIES ADULT PO) Oral   sildenafil (REVATIO) 20 MG tablet Take 3-4 tablets by mouth at bedtime as needed   Vitamin D3 2,000 Units, Oral, Daily       Objective:   Physical Exam Skin:        BP 136/80   Pulse 70   Temp 98.4 F (36.9 C) (Oral)   Resp 16   Ht 5\' 9"  (1.753 m)   Wt 197 lb 2 oz (89.4 kg)   SpO2 96%   BMI 29.11 kg/m  General:   Well developed, NAD, BMI noted. HEENT:  Normocephalic . Face symmetric, atraumatic See picture Neurologic:  alert & oriented X3.  Speech normal, gait appropriate for age and unassisted Psych--  Cognition and judgment appear intact.  Cooperative with normal attention span and concentration.  Behavior appropriate. No anxious or depressed appearing.      Assessment    ASSESSMENT History of kidney stones Colon polyps Vitamin D deficiency  PLAN  acute visit. Subcutaneous cyst: Suspect Dx of lump under the skin is a subcutaneous cyst, most likely benign due to location and mobility.  Recommend observation for now.  Call if increasing size, gets red or swollen. Ephelides (Freckles): Has superficial, macular, light brown skin lesions on the right ear,  see picture, likely benign. We agreed on observation, red flag symptoms discussed with patient. Aware that the 2 above conditions can only be diagnosed with 100% certainty by performing a   excision o biopsy.  Again, we agreed on observation. Obesity: Started tirzepatide July 2024,Rx elsewhere,  baseline weight 215 pounds, current weight 198 pounds at home. Plans to do it for few more weeks.

## 2023-06-04 NOTE — Patient Instructions (Signed)
Vaccines I recommend: Covid booster- new this fall Flu shot this fall

## 2023-06-05 NOTE — Assessment & Plan Note (Signed)
acute visit. Subcutaneous cyst: Suspect Dx of lump under the skin is a subcutaneous cyst, most likely benign due to location and mobility.  Recommend observation for now.  Call if increasing size, gets red or swollen. Ephelides (Freckles): Has superficial, macular, light brown skin lesions on the right ear, see picture, likely benign. We agreed on observation, red flag symptoms discussed with patient. Aware that the 2 above conditions can only be diagnosed with 100% certainty by performing a   excision o biopsy.  Again, we agreed on observation. Obesity: Started tirzepatide July 2024,Rx elsewhere,  baseline weight 215 pounds, current weight 198 pounds at home. Plans to do it for few more weeks.

## 2023-09-21 ENCOUNTER — Encounter: Payer: Self-pay | Admitting: Internal Medicine

## 2023-09-21 ENCOUNTER — Ambulatory Visit (INDEPENDENT_AMBULATORY_CARE_PROVIDER_SITE_OTHER): Payer: Commercial Managed Care - PPO | Admitting: Internal Medicine

## 2023-09-21 VITALS — BP 124/80 | HR 69 | Temp 97.8°F | Resp 16 | Ht 69.0 in | Wt 199.2 lb

## 2023-09-21 DIAGNOSIS — E559 Vitamin D deficiency, unspecified: Secondary | ICD-10-CM | POA: Diagnosis not present

## 2023-09-21 DIAGNOSIS — Z23 Encounter for immunization: Secondary | ICD-10-CM

## 2023-09-21 DIAGNOSIS — Z0000 Encounter for general adult medical examination without abnormal findings: Secondary | ICD-10-CM | POA: Diagnosis not present

## 2023-09-21 LAB — CBC WITH DIFFERENTIAL/PLATELET
Basophils Absolute: 0.1 10*3/uL (ref 0.0–0.1)
Basophils Relative: 0.9 % (ref 0.0–3.0)
Eosinophils Absolute: 0.1 10*3/uL (ref 0.0–0.7)
Eosinophils Relative: 1.7 % (ref 0.0–5.0)
HCT: 44.3 % (ref 39.0–52.0)
Hemoglobin: 15.2 g/dL (ref 13.0–17.0)
Lymphocytes Relative: 28.2 % (ref 12.0–46.0)
Lymphs Abs: 1.7 10*3/uL (ref 0.7–4.0)
MCHC: 34.4 g/dL (ref 30.0–36.0)
MCV: 89 fL (ref 78.0–100.0)
Monocytes Absolute: 0.6 10*3/uL (ref 0.1–1.0)
Monocytes Relative: 9.5 % (ref 3.0–12.0)
Neutro Abs: 3.6 10*3/uL (ref 1.4–7.7)
Neutrophils Relative %: 59.7 % (ref 43.0–77.0)
Platelets: 279 10*3/uL (ref 150.0–400.0)
RBC: 4.97 Mil/uL (ref 4.22–5.81)
RDW: 13.2 % (ref 11.5–15.5)
WBC: 6.1 10*3/uL (ref 4.0–10.5)

## 2023-09-21 LAB — COMPREHENSIVE METABOLIC PANEL
ALT: 17 U/L (ref 0–53)
AST: 15 U/L (ref 0–37)
Albumin: 4.5 g/dL (ref 3.5–5.2)
Alkaline Phosphatase: 44 U/L (ref 39–117)
BUN: 14 mg/dL (ref 6–23)
CO2: 27 meq/L (ref 19–32)
Calcium: 9.1 mg/dL (ref 8.4–10.5)
Chloride: 105 meq/L (ref 96–112)
Creatinine, Ser: 0.9 mg/dL (ref 0.40–1.50)
GFR: 91.93 mL/min (ref >60.00)
Glucose, Bld: 93 mg/dL (ref 70–99)
Potassium: 4.3 meq/L (ref 3.5–5.1)
Sodium: 139 meq/L (ref 135–145)
Total Bilirubin: 0.7 mg/dL (ref 0.2–1.2)
Total Protein: 6.6 g/dL (ref 6.0–8.3)

## 2023-09-21 LAB — VITAMIN D 25 HYDROXY (VIT D DEFICIENCY, FRACTURES): VITD: 31.29 ng/mL (ref 30.00–100.00)

## 2023-09-21 LAB — LIPID PANEL
Cholesterol: 186 mg/dL (ref 0–200)
HDL: 52.7 mg/dL (ref >39.00)
LDL Cholesterol: 119 mg/dL — ABNORMAL HIGH (ref 0–99)
NonHDL: 133.18
Total CHOL/HDL Ratio: 4
Triglycerides: 71 mg/dL (ref 0.0–149.0)
VLDL: 14.2 mg/dL (ref 0.0–40.0)

## 2023-09-21 LAB — PSA: PSA: 1.48 ng/mL (ref 0.10–4.00)

## 2023-09-21 NOTE — Assessment & Plan Note (Signed)
 Here for CPX -Td 09-2023 -S/p shingrix x2 -Flu shot today -Vaccines I recommend: COVID booster - Prostate ca screening:  check PSA  - CCS: Colonoscopy 2014, colonoscopy 10/05/2019, + polyps x 2, next in 10 years per GI letter - FH CAD father, he was a smoker: [name] has no sxs  - Labs CMP FLP vitamin D  CBC PSA - Lifestyle: Still playing tennis, active, room for improvement on diet. - Healthcare POA: See AVS

## 2023-09-21 NOTE — Progress Notes (Signed)
 Subjective:    Patient ID: [name], male    DOB: 01-09-1961, 63 y.o.   MRN: [id number]  DOS:  09/21/2023 Type of visit - description: CPX  Here for CPX.  Feeling well.  Has no major concerns.   Review of Systems   A 14 point review of systems is negative     Past Medical History:  Diagnosis Date   Adenomatous polyps    Allergy    Spring - prn allegra if needed    History of kidney stones    Vitamin D  deficiency     Past Surgical History:  Procedure Laterality Date   COLONOSCOPY     POLYPECTOMY     Social History   Socioeconomic History   Marital status: Married    Spouse name: Not on file   Number of children: 2   Years of education: Not on file   Highest education level: Bachelor's degree (e.g., BA, AB, BS)  Occupational History   Occupation: advertising account planner   Tobacco Use   Smoking status: Never   Smokeless tobacco: Never  Substance and Sexual Activity   Alcohol use: Yes    Comment: rarely   Drug use: No   Sexual activity: Not on file  Other Topics Concern   Not on file  Social History Narrative   Household: pt and wife    Boy: 2001   Boy: 2004      Play tennis    Social Drivers of Health   Financial Resource Strain: Low Risk  (06/04/2023)   Overall Financial Resource Strain (CARDIA)    Difficulty of Paying Living Expenses: Not hard at all  Food Insecurity: No Food Insecurity (06/04/2023)   Hunger Vital Sign    Worried About Running Out of Food in the Last Year: Never true    Ran Out of Food in the Last Year: Never true  Transportation Needs: No Transportation Needs (06/04/2023)   PRAPARE - Administrator, Civil Service (Medical): No    Lack of Transportation (Non-Medical): No  Physical Activity: Insufficiently Active (06/04/2023)   Exercise Vital Sign    Days of Exercise per Week: 4 days    Minutes of Exercise per Session: 30 min  Stress: No Stress Concern Present (06/04/2023)   [employer] of Occupational Health -  Occupational Stress Questionnaire    Feeling of Stress : Only a little  Social Connections: Socially Integrated (06/04/2023)   Social Connection and Isolation Panel [NHANES]    Frequency of Communication with Friends and Family: More than three times a week    Frequency of Social Gatherings with Friends and Family: Once a week    Attends Religious Services: 1 to 4 times per year    Active Member of [employer] or Organizations: Yes    Attends Engineer, Structural: More than 4 times per year    Marital Status: Married  Catering Manager Violence: Not on file    Current Outpatient Medications  Medication Instructions   ibuprofen (ADVIL) 400 mg, As needed   Multiple Vitamins-Minerals (MULTIVITAMIN GUMMIES ADULT PO) Take by mouth.   sildenafil  (REVATIO ) 20 MG tablet Take 3-4 tablets by mouth at bedtime as needed   TIRZEPATIDE [place] Inject into the skin.   Vitamin D3 2,000 Units, Oral, Daily       Objective:   Physical Exam BP 124/80   Pulse 69   Temp 97.8 F (36.6 C) (Oral)   Resp 16   Ht 5' 9 (  1.753 m)   Wt 199 lb 4 oz (90.4 kg)   SpO2 98%   BMI 29.42 kg/m  General: Well developed, NAD, BMI noted Neck: No  thyromegaly  HEENT:  Normocephalic . Face symmetric, atraumatic Lungs:  CTA B Normal respiratory effort, no intercostal retractions, no accessory muscle use. Heart: RRR,  no murmur.  Abdomen:  Not distended, soft, non-tender. No rebound or rigidity.   Lower extremities: no pretibial edema bilaterally  Skin: Exposed areas without rash. Not pale. Not jaundice Neurologic:  alert & oriented X3.  Speech normal, gait appropriate for age and unassisted Strength symmetric and appropriate for age.  Psych: Cognition and judgment appear intact.  Cooperative with normal attention span and concentration.  Behavior appropriate. No anxious or depressed appearing.     Assessment    Problem list History of kidney stones Colon polyps Vitamin D  deficiency  PLAN  Here for  CPX -Td 09-2023 -S/p shingrix x2 -Flu shot today -Vaccines I recommend: COVID booster - Prostate ca screening:  check PSA  - CCS: Colonoscopy 2014, colonoscopy 10/05/2019, + polyps x 2, next in 10 years per GI letter - FH CAD father, he was a smoker: [name] has no sxs  - Labs CMP FLP vitamin D  CBC PSA - Lifestyle: Still playing tennis, active, room for improvement on diet. - Healthcare POA: See AVS Vitamin D  deficiency: Has not been consistent taking OTC supplements, checking levels. Obesity: Started tirzepatide July 2024, baseline weight 215 pounds, current weight 199, stable for the last few months.  Room for improvement on diet, encouraged to eat healthier, smaller portions, less carbohydrates. If he decides for me to prescribe tirzepatide, come back in 3 months. Otherwise RTC in 1 year

## 2023-09-21 NOTE — Assessment & Plan Note (Signed)
 Here for CPX  Vitamin D  deficiency: Has not been consistent taking OTC supplements, checking levels. Obesity: Started tirzepatide July 2024, baseline weight 215 pounds, current weight 199, stable for the last few months.  Room for improvement on diet, encouraged to eat healthier, smaller portions, less carbohydrates. If he decides for me to prescribe tirzepatide, come back in 3 months. Otherwise RTC in 1 year

## 2023-09-21 NOTE — Patient Instructions (Addendum)
 Vaccines I recommend: Covid booster      GO TO THE LAB : Get the blood work     Next visit with me in 1 year for a physical exam. If you like me to start prescribing tirzepatide , come back in 3 months    Please schedule it at the front desk        Health Care Power of attorney ,  Living will (Advance care planning documents)  If you already have a living will or healthcare power of attorney, is recommended you bring the copy to be scanned in your chart.   The document will be available to all the doctors you see in the system.  Advance care planning is a process that supports adults in  understanding and sharing their preferences regarding future medical care.  The patient's preferences are recorded in documents called Advance Directives and the can be modified at any time while the patient is in full mental capacity.   If you don't have one, please consider create one.      More information at: [email or web address]

## 2024-10-03 ENCOUNTER — Ambulatory Visit: Payer: Commercial Managed Care - PPO | Admitting: Internal Medicine

## 2024-10-03 ENCOUNTER — Encounter: Payer: Self-pay | Admitting: Internal Medicine

## 2024-10-03 VITALS — BP 126/82 | HR 66 | Temp 97.5°F | Resp 16 | Ht 69.0 in | Wt 199.2 lb

## 2024-10-03 DIAGNOSIS — Z6829 Body mass index (BMI) 29.0-29.9, adult: Secondary | ICD-10-CM | POA: Diagnosis not present

## 2024-10-03 DIAGNOSIS — Z23 Encounter for immunization: Secondary | ICD-10-CM

## 2024-10-03 DIAGNOSIS — Z13220 Encounter for screening for lipoid disorders: Secondary | ICD-10-CM | POA: Diagnosis not present

## 2024-10-03 DIAGNOSIS — E559 Vitamin D deficiency, unspecified: Secondary | ICD-10-CM | POA: Diagnosis not present

## 2024-10-03 DIAGNOSIS — Z125 Encounter for screening for malignant neoplasm of prostate: Secondary | ICD-10-CM

## 2024-10-03 DIAGNOSIS — Z131 Encounter for screening for diabetes mellitus: Secondary | ICD-10-CM

## 2024-10-03 DIAGNOSIS — Z0000 Encounter for general adult medical examination without abnormal findings: Secondary | ICD-10-CM

## 2024-10-03 DIAGNOSIS — E6609 Other obesity due to excess calories: Secondary | ICD-10-CM

## 2024-10-03 LAB — CBC WITH DIFFERENTIAL/PLATELET
Basophils Absolute: 0 K/uL (ref 0.0–0.1)
Basophils Relative: 0.8 % (ref 0.0–3.0)
Eosinophils Absolute: 0.1 K/uL (ref 0.0–0.7)
Eosinophils Relative: 2.8 % (ref 0.0–5.0)
HCT: 44.8 % (ref 39.0–52.0)
Hemoglobin: 15.6 g/dL (ref 13.0–17.0)
Lymphocytes Relative: 36.2 % (ref 12.0–46.0)
Lymphs Abs: 1.8 K/uL (ref 0.7–4.0)
MCHC: 34.8 g/dL (ref 30.0–36.0)
MCV: 87.4 fl (ref 78.0–100.0)
Monocytes Absolute: 0.6 K/uL (ref 0.1–1.0)
Monocytes Relative: 11.7 % (ref 3.0–12.0)
Neutro Abs: 2.4 K/uL (ref 1.4–7.7)
Neutrophils Relative %: 48.5 % (ref 43.0–77.0)
Platelets: 284 K/uL (ref 150.0–400.0)
RBC: 5.13 Mil/uL (ref 4.22–5.81)
RDW: 12.5 % (ref 11.5–15.5)
WBC: 5 K/uL (ref 4.0–10.5)

## 2024-10-03 LAB — LIPID PANEL
Cholesterol: 171 mg/dL (ref 28–200)
HDL: 53.9 mg/dL
LDL Cholesterol: 104 mg/dL — ABNORMAL HIGH (ref 10–99)
NonHDL: 116.92
Total CHOL/HDL Ratio: 3
Triglycerides: 66 mg/dL (ref 10.0–149.0)
VLDL: 13.2 mg/dL (ref 0.0–40.0)

## 2024-10-03 LAB — COMPREHENSIVE METABOLIC PANEL WITH GFR
ALT: 15 U/L (ref 3–53)
AST: 16 U/L (ref 5–37)
Albumin: 4.7 g/dL (ref 3.5–5.2)
Alkaline Phosphatase: 42 U/L (ref 39–117)
BUN: 13 mg/dL (ref 6–23)
CO2: 29 meq/L (ref 19–32)
Calcium: 9.3 mg/dL (ref 8.4–10.5)
Chloride: 106 meq/L (ref 96–112)
Creatinine, Ser: 1.07 mg/dL (ref 0.40–1.50)
GFR: 74.15 mL/min
Glucose, Bld: 86 mg/dL (ref 70–99)
Potassium: 4.3 meq/L (ref 3.5–5.1)
Sodium: 140 meq/L (ref 135–145)
Total Bilirubin: 0.8 mg/dL (ref 0.2–1.2)
Total Protein: 7 g/dL (ref 6.0–8.3)

## 2024-10-03 LAB — HEMOGLOBIN A1C: Hgb A1c MFr Bld: 5.2 % (ref 4.6–6.5)

## 2024-10-03 LAB — VITAMIN D 25 HYDROXY (VIT D DEFICIENCY, FRACTURES): VITD: 28.9 ng/mL — ABNORMAL LOW (ref 30.00–100.00)

## 2024-10-03 LAB — PSA: PSA: 1.02 ng/mL (ref 0.10–4.00)

## 2024-10-03 NOTE — Progress Notes (Unsigned)
 "  Subjective:    Patient ID: [name], male    DOB: 1960-10-22, 64 y.o.   MRN: [id number]  DOS:  10/03/2024 CPX  Discussed the use of AI scribe software for clinical note transcription with the patient, who gave verbal consent to proceed.  History of Present Illness [name] is a 64 year old male who presents for a routine follow-up and weight management.  Weight management - Weight managed with tirzepatide, initiated July 2024 - Weight decreased from 215 to 199 pounds with tirzepatide - Weight increased to 205 pounds after discontinuing tirzepatide - Weight returned to 199 pounds after resuming tirzepatide  Physical activity - Active with tennis a couple of times per week - Performs home strength training with light weights  Supplement use - Not currently taking vitamin D  after running out - Previously on 2000 units daily of vitamin D   Constitutional and gastrointestinal symptoms - No chest pain - No dyspnea - No nausea or vomiting - No hematuria - No blood in stool   Wt Readings from Last 3 Encounters:  10/03/24 199 lb 4 oz (90.4 kg)  09/21/23 199 lb 4 oz (90.4 kg)  06/04/23 197 lb 2 oz (89.4 kg)     Review of Systems See above   Past Medical History:  Diagnosis Date   Adenomatous polyps    Allergy    Spring - prn allegra if needed    History of kidney stones    Vitamin D  deficiency     Past Surgical History:  Procedure Laterality Date   COLONOSCOPY     POLYPECTOMY      Current Outpatient Medications  Medication Instructions   ibuprofen (ADVIL) 400 mg, As needed   Multiple Vitamins-Minerals (MULTIVITAMIN GUMMIES ADULT PO) Take by mouth.   sildenafil  (REVATIO ) 20 MG tablet Take 3-4 tablets by mouth at bedtime as needed   TIRZEPATIDE  Inject into the skin.   Vitamin D3 2,000 Units, Oral, Daily       Objective:   Physical Exam BP 126/82   Pulse 66   Temp (!) 97.5 F (36.4 C) (Oral)   Resp 16   Ht 5' 9 (1.753 m)    Wt 199 lb 4 oz (90.4 kg)   SpO2 97%   BMI 29.42 kg/m  General: Well developed, NAD, BMI noted Neck: No  thyromegaly  HEENT:  Normocephalic . Face symmetric, atraumatic Lungs:  CTA B Normal respiratory effort, no intercostal retractions, no accessory muscle use. Heart: RRR,  no murmur.  Abdomen:  Not distended, soft, non-tender. No rebound or rigidity.   Lower extremities: no pretibial edema bilaterally  Skin: Exposed areas without rash. Not pale. Not jaundice Neurologic:  alert & oriented X3.  Speech normal, gait appropriate for age and unassisted Strength symmetric and appropriate for age.  Psych: Cognition and judgment appear intact.  Cooperative with normal attention span and concentration.  Behavior appropriate. No anxious or depressed appearing.     Assessment   Problem list History of kidney stones Colon polyps Vitamin D  deficiency  PLAN  Here for CPX -Td 09-2023 -S/p shingrix x2 -Vaccines I recommend: Flu shot, COVID booster if not done recently. - Prostate ca screening:  check PSA  - CCS: Colonoscopy 2014, colonoscopy 10/05/2019, + polyps x 2, next in 10 years per GI letter - FH CAD father, he was a smoker: [name] has no sxs  - Labs: CMP FLP CBC A1c PSA vitamin D   - Lifestyle: Plays tennis, doing some  strengthening exercises - Cardiovascular risk: 9%, borderline indication for statins, this was discussed with the patient.  Will run this calculation again with a new FLP today.    Assessment & Plan Obesity A year ago was taking tirzepatide (from another prescriber), he stopped it and noted weight gain, recently restarted.  He is losing weight again. Current weight is 199 pounds, BMI is 29, his goal is to go down to around 190 pounds. Encouraged healthy diet, stay active and monitor his weight. Vitamin D : Has not been taking supplements lately, encouraged to do 2000 units daily, checking levels RTC 1 year.  Sooner if needed y.   "

## 2024-10-03 NOTE — Patient Instructions (Signed)
 Please read your instructions carefully.   GO TO THE LAB :  Get the blood work    Go to the front desk for the checkout Please make an appointment for a physical exam in 1 year.  You got your flu shot today Consider COVID-vaccine at the pharmacy

## 2024-10-04 ENCOUNTER — Encounter: Payer: Self-pay | Admitting: Internal Medicine

## 2024-10-04 ENCOUNTER — Ambulatory Visit: Payer: Self-pay | Admitting: Internal Medicine

## 2024-10-04 MED ORDER — VITAMIN D (ERGOCALCIFEROL) 1.25 MG (50000 UNIT) PO CAPS: 50000.0000 [IU] | ORAL_CAPSULE | ORAL | 0 refills | Status: AC

## 2024-10-04 NOTE — Assessment & Plan Note (Signed)
 Here for CPX -Td 09-2023 -S/p shingrix x2 - Flu shot today, also recommend a COVID booster if not recently done. - Prostate ca screening:  check PSA  - CCS: Colonoscopy 2014, colonoscopy 10/05/2019, + polyps x 2, next in 10 years per GI letter - FH CAD father, he was a smoker: [name] has no sxs  - Labs: CMP FLP CBC A1c PSA vitamin D  - Lifestyle: Plays tennis, doing some strengthening exercises - Cardiovascular risk: 9%, borderline indication for statins, this was discussed with the patient.  Will run this calculation again with a new FLP today.

## 2024-10-04 NOTE — Assessment & Plan Note (Signed)
 Here for CPX Other issues Obesity A year ago was taking tirzepatide (from another prescriber), he stopped it and noted weight gain, recently restarted.  He is losing weight again. Current weight is 199 pounds, BMI is 29, his goal is to go down to around 190 pounds. Encouraged healthy diet, stay active and monitor his weight. Vitamin D : Has not been taking supplements lately, encouraged to do 2000 units daily, checking levels RTC 1 year.  Sooner if needed

## 2025-10-09 ENCOUNTER — Encounter: Admitting: Internal Medicine
